# Patient Record
Sex: Female | Born: 1935 | Race: White | Hispanic: No | Smoking: Current every day smoker
Health system: Southern US, Community
[De-identification: ages and names within clinical notes are randomized; demographics above are authoritative.]

## PROBLEM LIST (undated history)

## (undated) DIAGNOSIS — I350 Nonrheumatic aortic (valve) stenosis: Secondary | ICD-10-CM

## (undated) DIAGNOSIS — C3490 Malignant neoplasm of unspecified part of unspecified bronchus or lung: Secondary | ICD-10-CM

## (undated) DIAGNOSIS — M1990 Unspecified osteoarthritis, unspecified site: Secondary | ICD-10-CM

## (undated) DIAGNOSIS — I2510 Atherosclerotic heart disease of native coronary artery without angina pectoris: Secondary | ICD-10-CM

## (undated) DIAGNOSIS — C649 Malignant neoplasm of unspecified kidney, except renal pelvis: Secondary | ICD-10-CM

## (undated) DIAGNOSIS — J449 Chronic obstructive pulmonary disease, unspecified: Secondary | ICD-10-CM

## (undated) DIAGNOSIS — J302 Other seasonal allergic rhinitis: Secondary | ICD-10-CM

## (undated) DIAGNOSIS — K219 Gastro-esophageal reflux disease without esophagitis: Secondary | ICD-10-CM

## (undated) DIAGNOSIS — E1140 Type 2 diabetes mellitus with diabetic neuropathy, unspecified: Secondary | ICD-10-CM

## (undated) DIAGNOSIS — G5600 Carpal tunnel syndrome, unspecified upper limb: Secondary | ICD-10-CM

## (undated) DIAGNOSIS — F329 Major depressive disorder, single episode, unspecified: Secondary | ICD-10-CM

## (undated) DIAGNOSIS — M542 Cervicalgia: Secondary | ICD-10-CM

## (undated) DIAGNOSIS — I10 Essential (primary) hypertension: Secondary | ICD-10-CM

## (undated) DIAGNOSIS — R918 Other nonspecific abnormal finding of lung field: Secondary | ICD-10-CM

## (undated) DIAGNOSIS — K760 Fatty (change of) liver, not elsewhere classified: Secondary | ICD-10-CM

## (undated) DIAGNOSIS — M5030 Other cervical disc degeneration, unspecified cervical region: Secondary | ICD-10-CM

## (undated) DIAGNOSIS — F32A Depression, unspecified: Secondary | ICD-10-CM

## (undated) DIAGNOSIS — E785 Hyperlipidemia, unspecified: Secondary | ICD-10-CM

## (undated) HISTORY — DX: Nonrheumatic aortic (valve) stenosis: I35.0

## (undated) HISTORY — DX: Essential (primary) hypertension: I10

## (undated) HISTORY — DX: Chronic obstructive pulmonary disease, unspecified: J44.9

## (undated) HISTORY — DX: Fatty (change of) liver, not elsewhere classified: K76.0

## (undated) HISTORY — DX: Other seasonal allergic rhinitis: J30.2

## (undated) HISTORY — DX: Atherosclerotic heart disease of native coronary artery without angina pectoris: I25.10

## (undated) HISTORY — DX: Cervicalgia: M54.2

## (undated) HISTORY — DX: Other nonspecific abnormal finding of lung field: R91.8

## (undated) HISTORY — DX: Malignant neoplasm of unspecified part of unspecified bronchus or lung: C34.90

## (undated) HISTORY — DX: Hyperlipidemia, unspecified: E78.5

## (undated) HISTORY — PX: LUMBAR DISC SURGERY: SHX700

## (undated) HISTORY — DX: Type 2 diabetes mellitus with diabetic neuropathy, unspecified: E11.40

## (undated) HISTORY — DX: Gastro-esophageal reflux disease without esophagitis: K21.9

## (undated) HISTORY — DX: Other cervical disc degeneration, unspecified cervical region: M50.30

## (undated) HISTORY — DX: Depression, unspecified: F32.A

## (undated) HISTORY — DX: Unspecified osteoarthritis, unspecified site: M19.90

## (undated) HISTORY — DX: Malignant neoplasm of unspecified kidney, except renal pelvis: C64.9

## (undated) HISTORY — DX: Carpal tunnel syndrome, unspecified upper limb: G56.00

## (undated) HISTORY — DX: Major depressive disorder, single episode, unspecified: F32.9

---

## 1968-07-16 HISTORY — PX: BREAST EXCISIONAL BIOPSY: SUR124

## 1975-07-17 HISTORY — PX: ABDOMINAL HYSTERECTOMY: SHX81

## 1998-10-14 ENCOUNTER — Other Ambulatory Visit: Admission: RE | Admit: 1998-10-14 | Discharge: 1998-10-14 | Payer: Self-pay | Admitting: Physician Assistant

## 1998-10-28 ENCOUNTER — Encounter: Payer: Self-pay | Admitting: Family Medicine

## 1998-10-28 ENCOUNTER — Ambulatory Visit (HOSPITAL_COMMUNITY): Admission: RE | Admit: 1998-10-28 | Discharge: 1998-10-28 | Payer: Self-pay

## 1999-11-03 ENCOUNTER — Encounter: Payer: Self-pay | Admitting: Family Medicine

## 1999-11-03 ENCOUNTER — Ambulatory Visit (HOSPITAL_COMMUNITY): Admission: RE | Admit: 1999-11-03 | Discharge: 1999-11-03 | Payer: Self-pay | Admitting: Family Medicine

## 2000-09-06 ENCOUNTER — Encounter: Payer: Self-pay | Admitting: Internal Medicine

## 2000-09-06 ENCOUNTER — Encounter: Admission: RE | Admit: 2000-09-06 | Discharge: 2000-09-06 | Payer: Self-pay | Admitting: Internal Medicine

## 2000-11-07 ENCOUNTER — Encounter: Payer: Self-pay | Admitting: Internal Medicine

## 2000-11-07 ENCOUNTER — Inpatient Hospital Stay (HOSPITAL_COMMUNITY): Admission: EM | Admit: 2000-11-07 | Discharge: 2000-11-08 | Payer: Self-pay | Admitting: Emergency Medicine

## 2001-10-12 ENCOUNTER — Emergency Department (HOSPITAL_COMMUNITY): Admission: EM | Admit: 2001-10-12 | Discharge: 2001-10-12 | Payer: Self-pay

## 2001-10-20 ENCOUNTER — Encounter: Admission: RE | Admit: 2001-10-20 | Discharge: 2001-10-20 | Payer: Self-pay | Admitting: Urology

## 2001-10-20 ENCOUNTER — Encounter: Payer: Self-pay | Admitting: Urology

## 2001-10-22 ENCOUNTER — Ambulatory Visit (HOSPITAL_BASED_OUTPATIENT_CLINIC_OR_DEPARTMENT_OTHER): Admission: RE | Admit: 2001-10-22 | Discharge: 2001-10-22 | Payer: Self-pay | Admitting: Urology

## 2001-10-22 ENCOUNTER — Encounter (INDEPENDENT_AMBULATORY_CARE_PROVIDER_SITE_OTHER): Payer: Self-pay | Admitting: Specialist

## 2001-11-07 ENCOUNTER — Encounter: Payer: Self-pay | Admitting: Urology

## 2001-11-14 ENCOUNTER — Inpatient Hospital Stay (HOSPITAL_COMMUNITY): Admission: RE | Admit: 2001-11-14 | Discharge: 2001-11-19 | Payer: Self-pay | Admitting: Urology

## 2001-11-14 ENCOUNTER — Encounter (INDEPENDENT_AMBULATORY_CARE_PROVIDER_SITE_OTHER): Payer: Self-pay | Admitting: Specialist

## 2002-01-19 ENCOUNTER — Encounter: Admission: RE | Admit: 2002-01-19 | Discharge: 2002-04-19 | Payer: Self-pay | Admitting: Internal Medicine

## 2002-04-20 ENCOUNTER — Emergency Department (HOSPITAL_COMMUNITY): Admission: EM | Admit: 2002-04-20 | Discharge: 2002-04-20 | Payer: Self-pay | Admitting: Emergency Medicine

## 2002-04-28 ENCOUNTER — Encounter: Payer: Self-pay | Admitting: Emergency Medicine

## 2002-04-28 ENCOUNTER — Inpatient Hospital Stay (HOSPITAL_COMMUNITY): Admission: EM | Admit: 2002-04-28 | Discharge: 2002-05-03 | Payer: Self-pay | Admitting: Emergency Medicine

## 2002-07-16 HISTORY — PX: NEPHRECTOMY: SHX65

## 2002-07-27 ENCOUNTER — Encounter: Payer: Self-pay | Admitting: Internal Medicine

## 2002-07-27 ENCOUNTER — Encounter: Admission: RE | Admit: 2002-07-27 | Discharge: 2002-07-27 | Payer: Self-pay | Admitting: Internal Medicine

## 2002-08-03 ENCOUNTER — Encounter: Admission: RE | Admit: 2002-08-03 | Discharge: 2002-08-03 | Payer: Self-pay | Admitting: Urology

## 2002-08-03 ENCOUNTER — Encounter: Payer: Self-pay | Admitting: Urology

## 2003-01-26 ENCOUNTER — Encounter: Payer: Self-pay | Admitting: Urology

## 2003-01-26 ENCOUNTER — Encounter: Admission: RE | Admit: 2003-01-26 | Discharge: 2003-01-26 | Payer: Self-pay | Admitting: Urology

## 2003-07-17 DIAGNOSIS — C649 Malignant neoplasm of unspecified kidney, except renal pelvis: Secondary | ICD-10-CM

## 2003-07-17 HISTORY — DX: Malignant neoplasm of unspecified kidney, except renal pelvis: C64.9

## 2004-01-28 LAB — HM DEXA SCAN

## 2004-03-02 ENCOUNTER — Emergency Department (HOSPITAL_COMMUNITY): Admission: EM | Admit: 2004-03-02 | Discharge: 2004-03-02 | Payer: Self-pay | Admitting: Emergency Medicine

## 2004-07-16 DIAGNOSIS — I2510 Atherosclerotic heart disease of native coronary artery without angina pectoris: Secondary | ICD-10-CM

## 2004-07-16 DIAGNOSIS — C3490 Malignant neoplasm of unspecified part of unspecified bronchus or lung: Secondary | ICD-10-CM

## 2004-07-16 HISTORY — DX: Malignant neoplasm of unspecified part of unspecified bronchus or lung: C34.90

## 2004-07-16 HISTORY — DX: Atherosclerotic heart disease of native coronary artery without angina pectoris: I25.10

## 2004-07-19 ENCOUNTER — Ambulatory Visit (HOSPITAL_COMMUNITY): Admission: RE | Admit: 2004-07-19 | Discharge: 2004-07-19 | Payer: Self-pay | Admitting: Urology

## 2004-07-20 ENCOUNTER — Encounter: Admission: RE | Admit: 2004-07-20 | Discharge: 2004-07-20 | Payer: Self-pay | Admitting: Internal Medicine

## 2004-10-14 HISTORY — PX: CARDIAC CATHETERIZATION: SHX172

## 2005-07-16 HISTORY — PX: SPIROMETRY: SHX456

## 2005-07-16 HISTORY — PX: LUNG CANCER SURGERY: SHX702

## 2005-07-23 ENCOUNTER — Ambulatory Visit: Admission: RE | Admit: 2005-07-23 | Discharge: 2005-07-23 | Payer: Self-pay | Admitting: Thoracic Surgery

## 2005-07-24 ENCOUNTER — Ambulatory Visit (HOSPITAL_COMMUNITY): Admission: RE | Admit: 2005-07-24 | Discharge: 2005-07-24 | Payer: Self-pay | Admitting: Thoracic Surgery

## 2005-08-03 ENCOUNTER — Encounter (INDEPENDENT_AMBULATORY_CARE_PROVIDER_SITE_OTHER): Payer: Self-pay | Admitting: Specialist

## 2005-08-03 ENCOUNTER — Inpatient Hospital Stay (HOSPITAL_COMMUNITY): Admission: RE | Admit: 2005-08-03 | Discharge: 2005-08-10 | Payer: Self-pay | Admitting: Thoracic Surgery

## 2005-08-03 ENCOUNTER — Ambulatory Visit: Payer: Self-pay | Admitting: Critical Care Medicine

## 2005-08-10 ENCOUNTER — Ambulatory Visit: Payer: Self-pay | Admitting: Internal Medicine

## 2005-08-15 ENCOUNTER — Encounter: Admission: RE | Admit: 2005-08-15 | Discharge: 2005-08-15 | Payer: Self-pay | Admitting: Thoracic Surgery

## 2005-09-05 ENCOUNTER — Encounter: Admission: RE | Admit: 2005-09-05 | Discharge: 2005-09-05 | Payer: Self-pay | Admitting: Thoracic Surgery

## 2005-09-11 ENCOUNTER — Encounter: Admission: RE | Admit: 2005-09-11 | Discharge: 2005-09-11 | Payer: Self-pay | Admitting: Internal Medicine

## 2005-10-03 ENCOUNTER — Encounter: Admission: RE | Admit: 2005-10-03 | Discharge: 2005-10-03 | Payer: Self-pay | Admitting: Thoracic Surgery

## 2005-10-08 ENCOUNTER — Emergency Department (HOSPITAL_COMMUNITY): Admission: EM | Admit: 2005-10-08 | Discharge: 2005-10-08 | Payer: Self-pay | Admitting: Emergency Medicine

## 2005-11-14 ENCOUNTER — Encounter: Admission: RE | Admit: 2005-11-14 | Discharge: 2005-11-14 | Payer: Self-pay | Admitting: Thoracic Surgery

## 2006-02-14 ENCOUNTER — Ambulatory Visit: Payer: Self-pay | Admitting: Internal Medicine

## 2006-06-01 ENCOUNTER — Emergency Department (HOSPITAL_COMMUNITY): Admission: EM | Admit: 2006-06-01 | Discharge: 2006-06-01 | Payer: Self-pay | Admitting: Emergency Medicine

## 2006-06-12 ENCOUNTER — Encounter: Admission: RE | Admit: 2006-06-12 | Discharge: 2006-06-12 | Payer: Self-pay | Admitting: Internal Medicine

## 2006-06-19 ENCOUNTER — Encounter: Admission: RE | Admit: 2006-06-19 | Discharge: 2006-06-19 | Payer: Self-pay | Admitting: Thoracic Surgery

## 2006-07-07 IMAGING — CR DG CHEST 1V PORT
1 series · 1 of 1 positions shown · non-contrast
Comparison: none

CLINICAL DATA: Status post right thoracotomy chest tube.
 PORTABLE CHEST ? 1 VIEW:

[view not recorded]
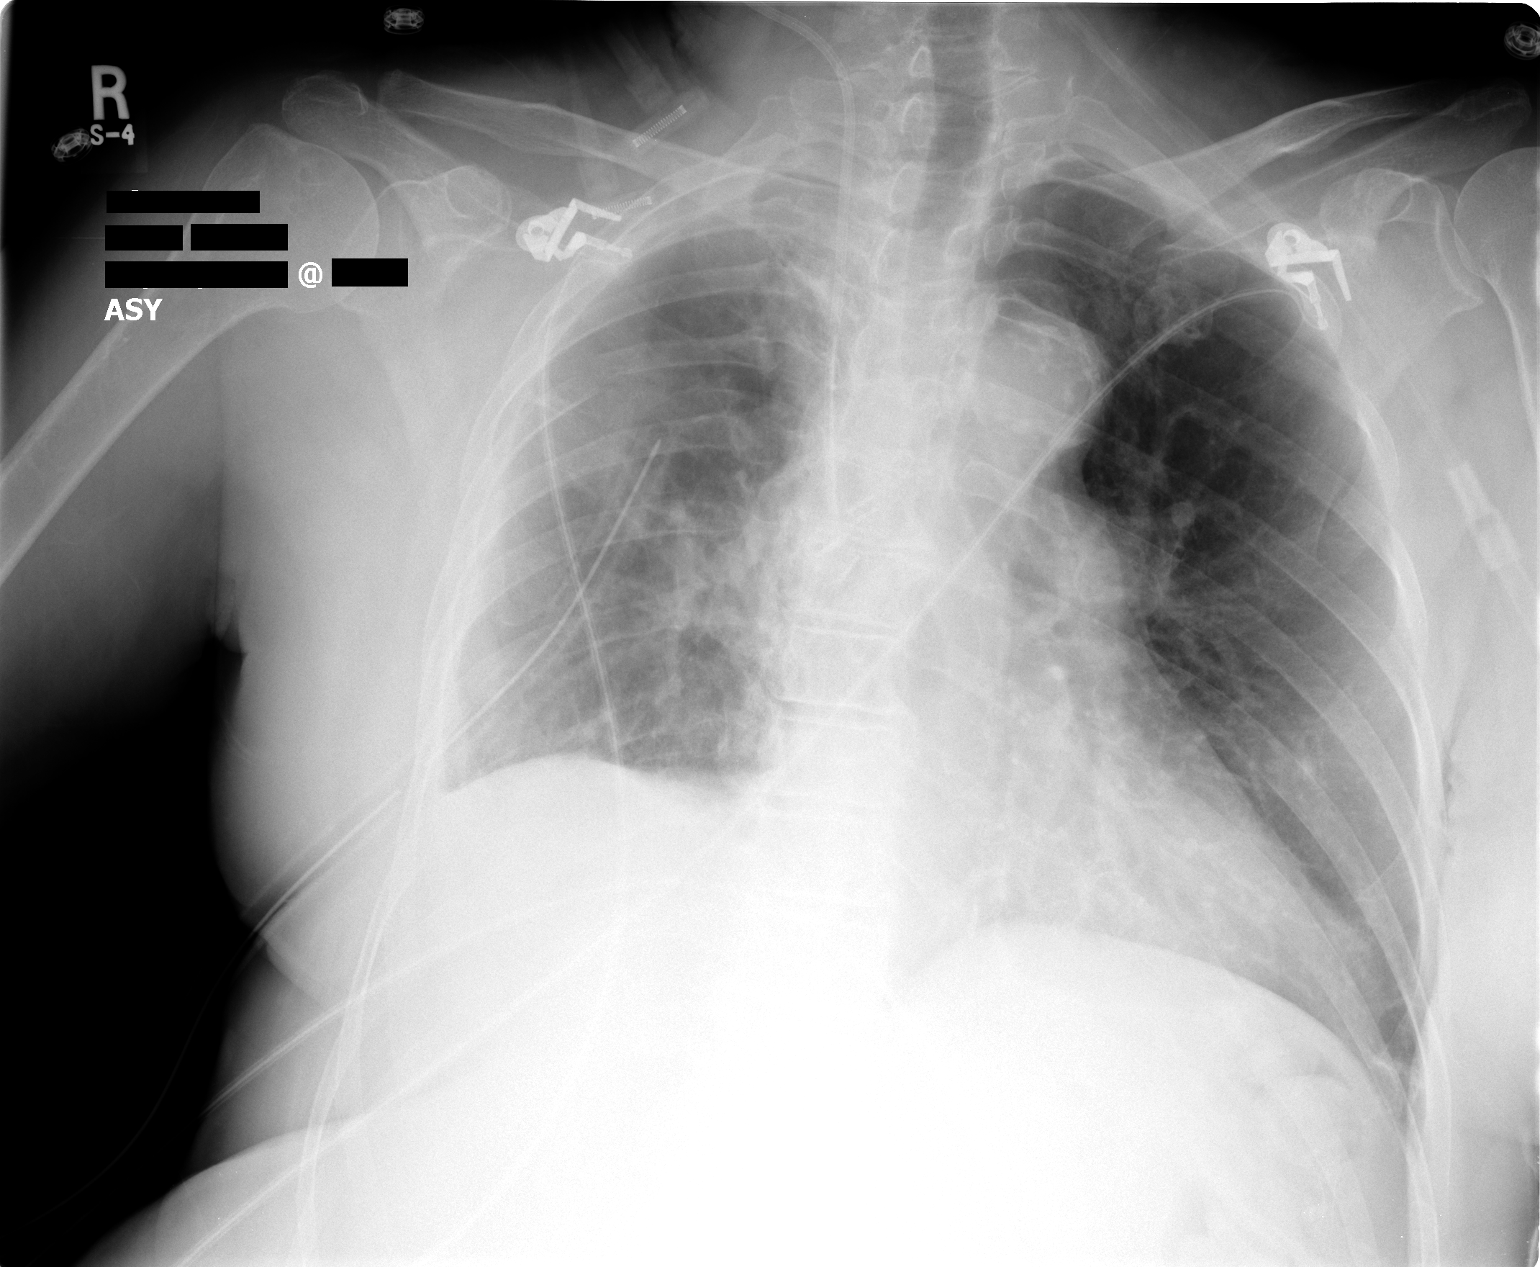

[1 of 1 positions shown; findings below may reference images not displayed]

FINDINGS: An AP semierect portable film of the chest made 08/09/05 at 9619 hours is compared to previous study of 08/08/05 at 4692 hours and shows significant improvement in right lung aeration.  There remains a single right chest tube, site hole of which is just at the level of the rib cage.  The right jugular central venous catheter remains in place.  There is again noted cardiomegaly.  No significant pneumothorax is seen.
IMPRESSION: Significant interval improvement in right lung aeration.  Single right chest tube remains in place.  Stable right jugular catheter.

## 2006-08-03 IMAGING — CR DG CHEST 2V
2 series · 2 of 2 positions shown · non-contrast
Comparison: 08/15/05.

CLINICAL DATA: Lung surgery July 2005.  Shortness of breath.  Followup.
 CHEST - 2 VIEW:

[w chest pa]
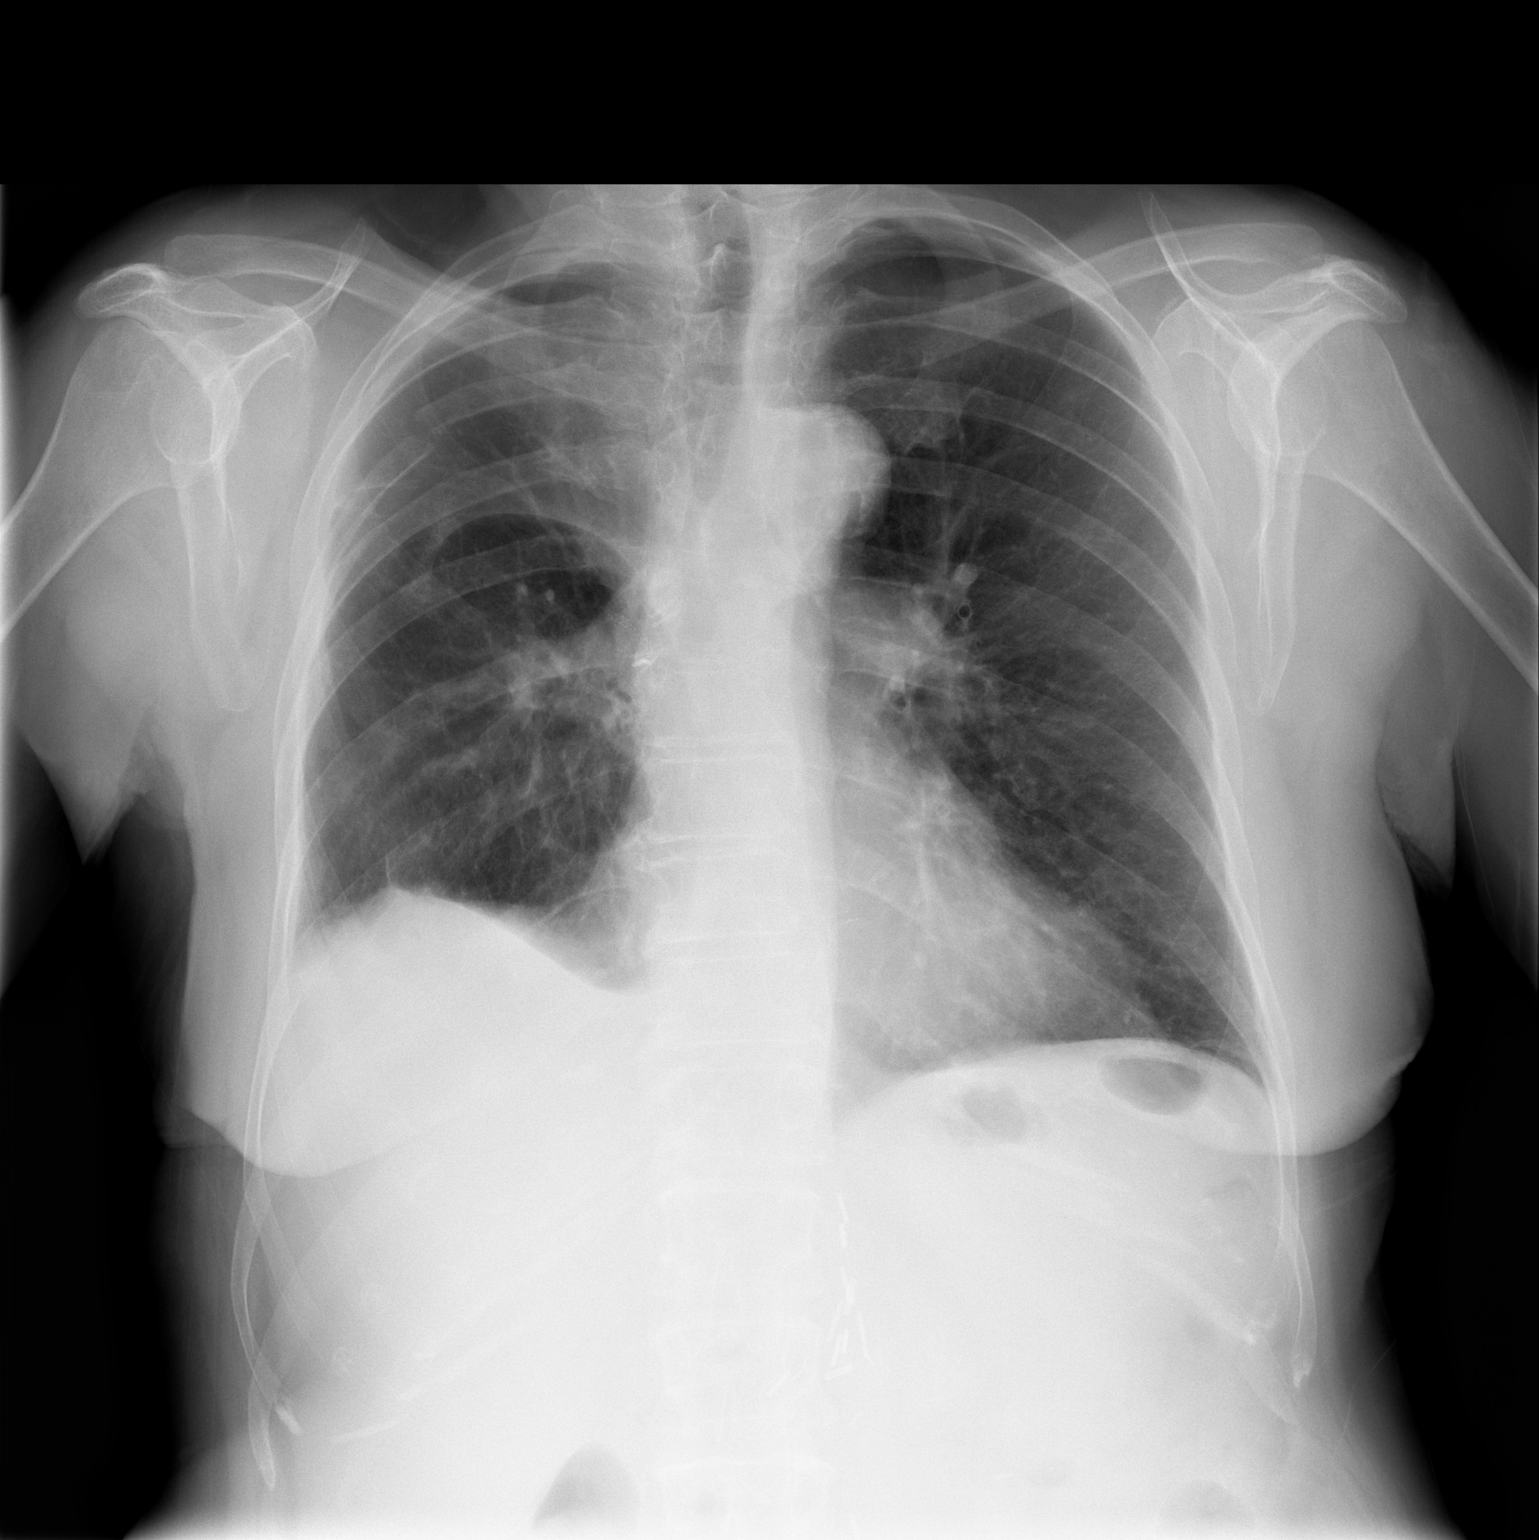

[w chest lat]
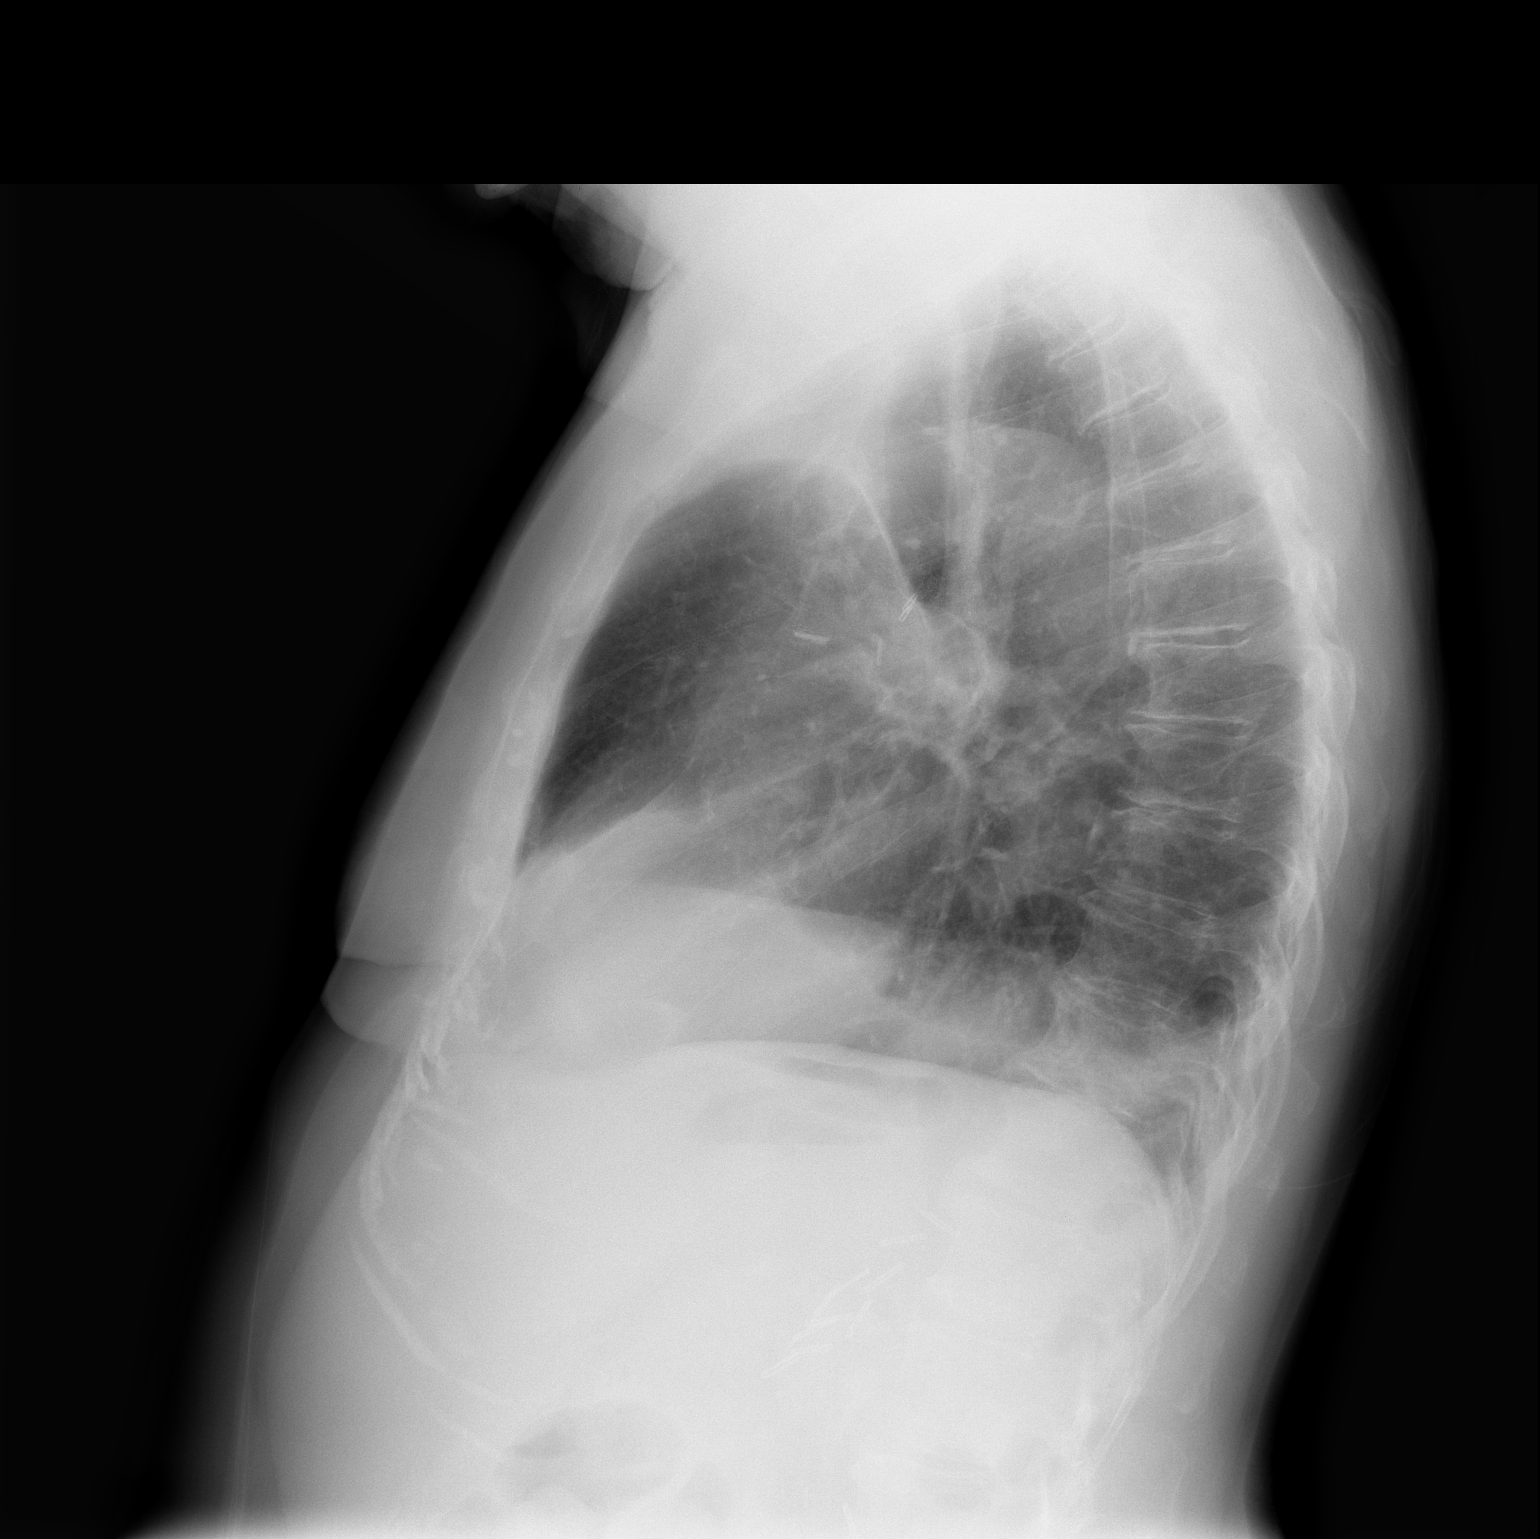

[2 of 2 positions shown; findings below may reference images not displayed]

FINDINGS: Two views of the chest show stable postop changes on the right.  The left lung is clear.  Heart size is stable.
IMPRESSION: Stable postop changes on the right.

## 2006-08-31 IMAGING — CR DG CHEST 2V
2 series · 2 of 2 positions shown · non-contrast
Comparison: 09/05/05.

CLINICAL DATA: Right lung surgery, lung lesion. 
 CHEST, TWO VIEWS:

[w chest pa]
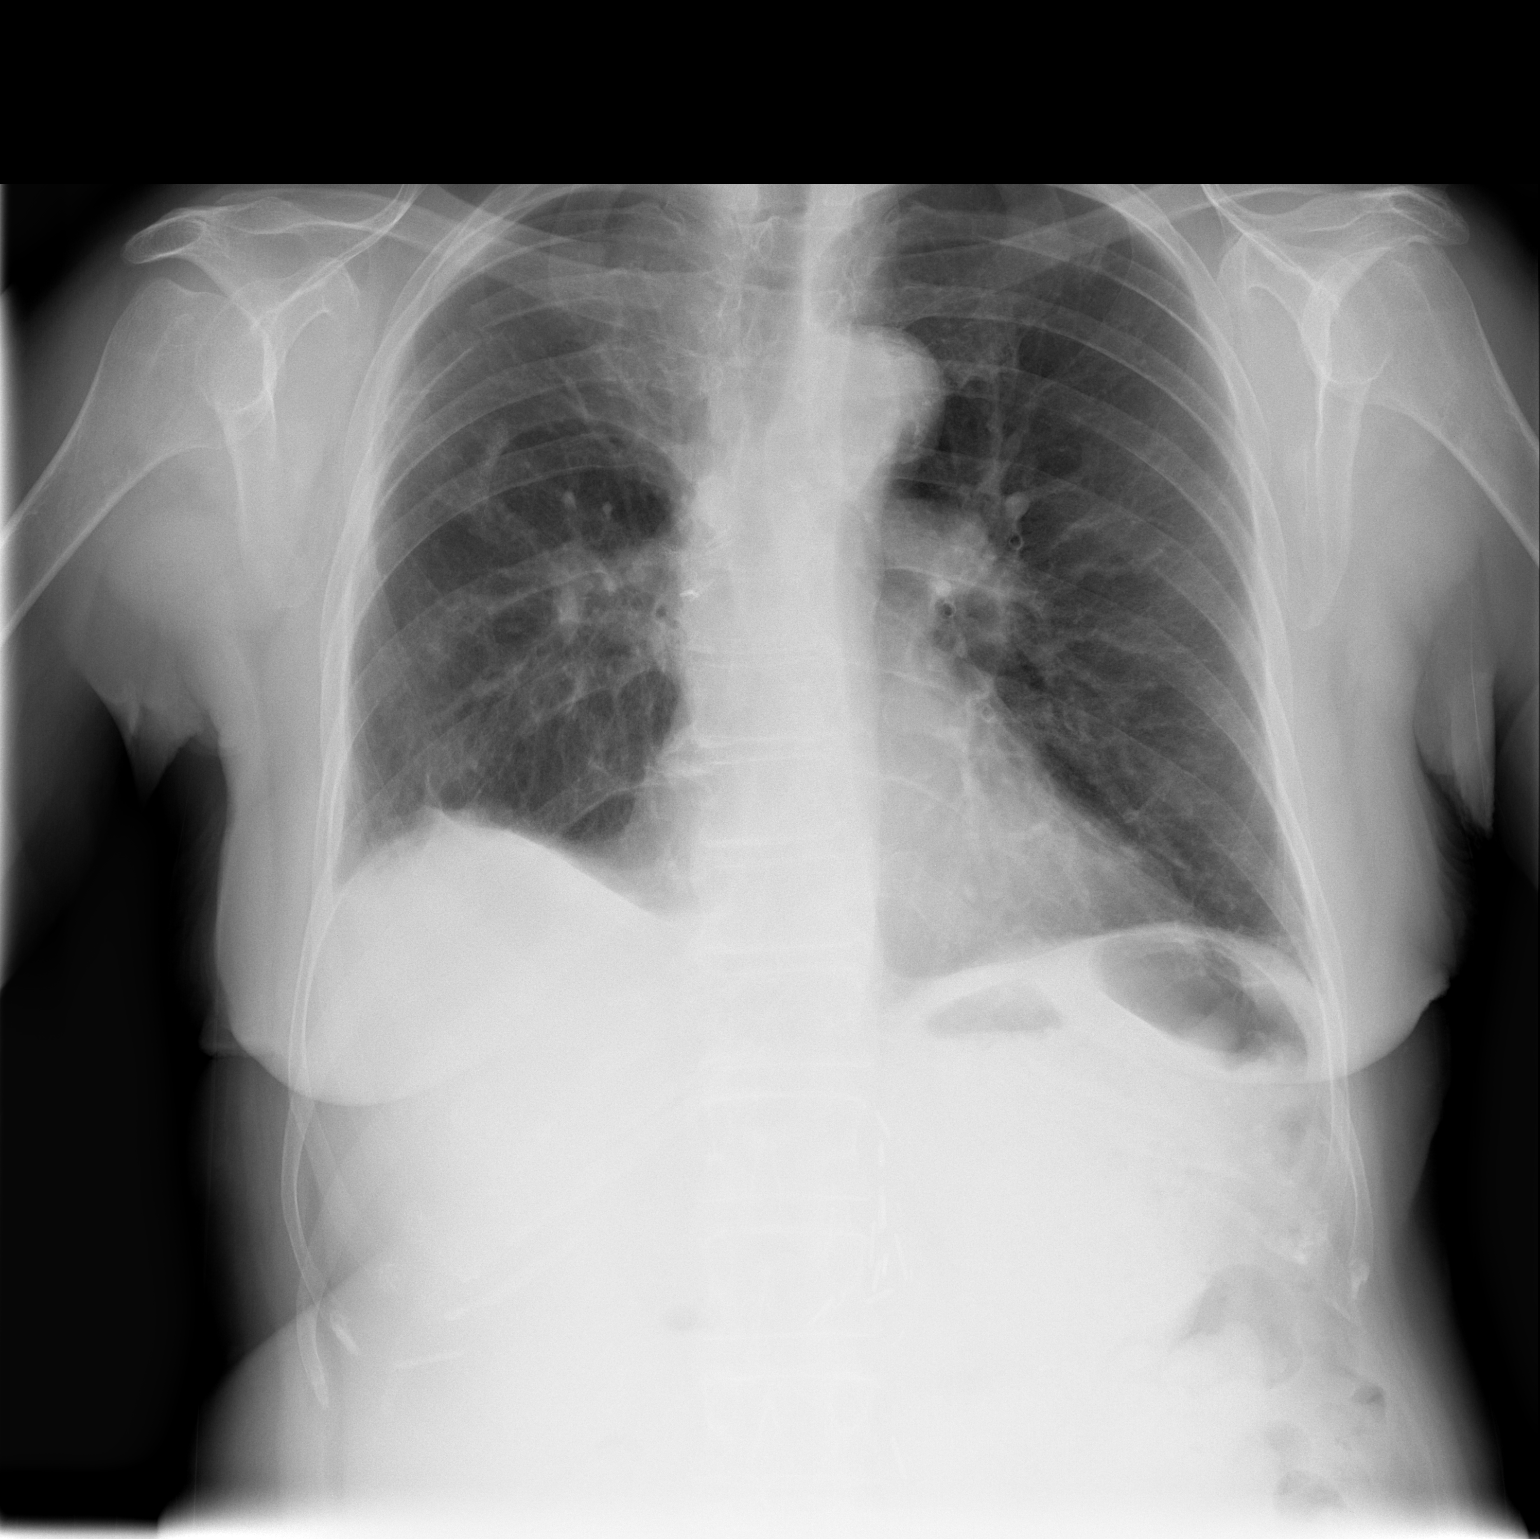

[w chest lat]
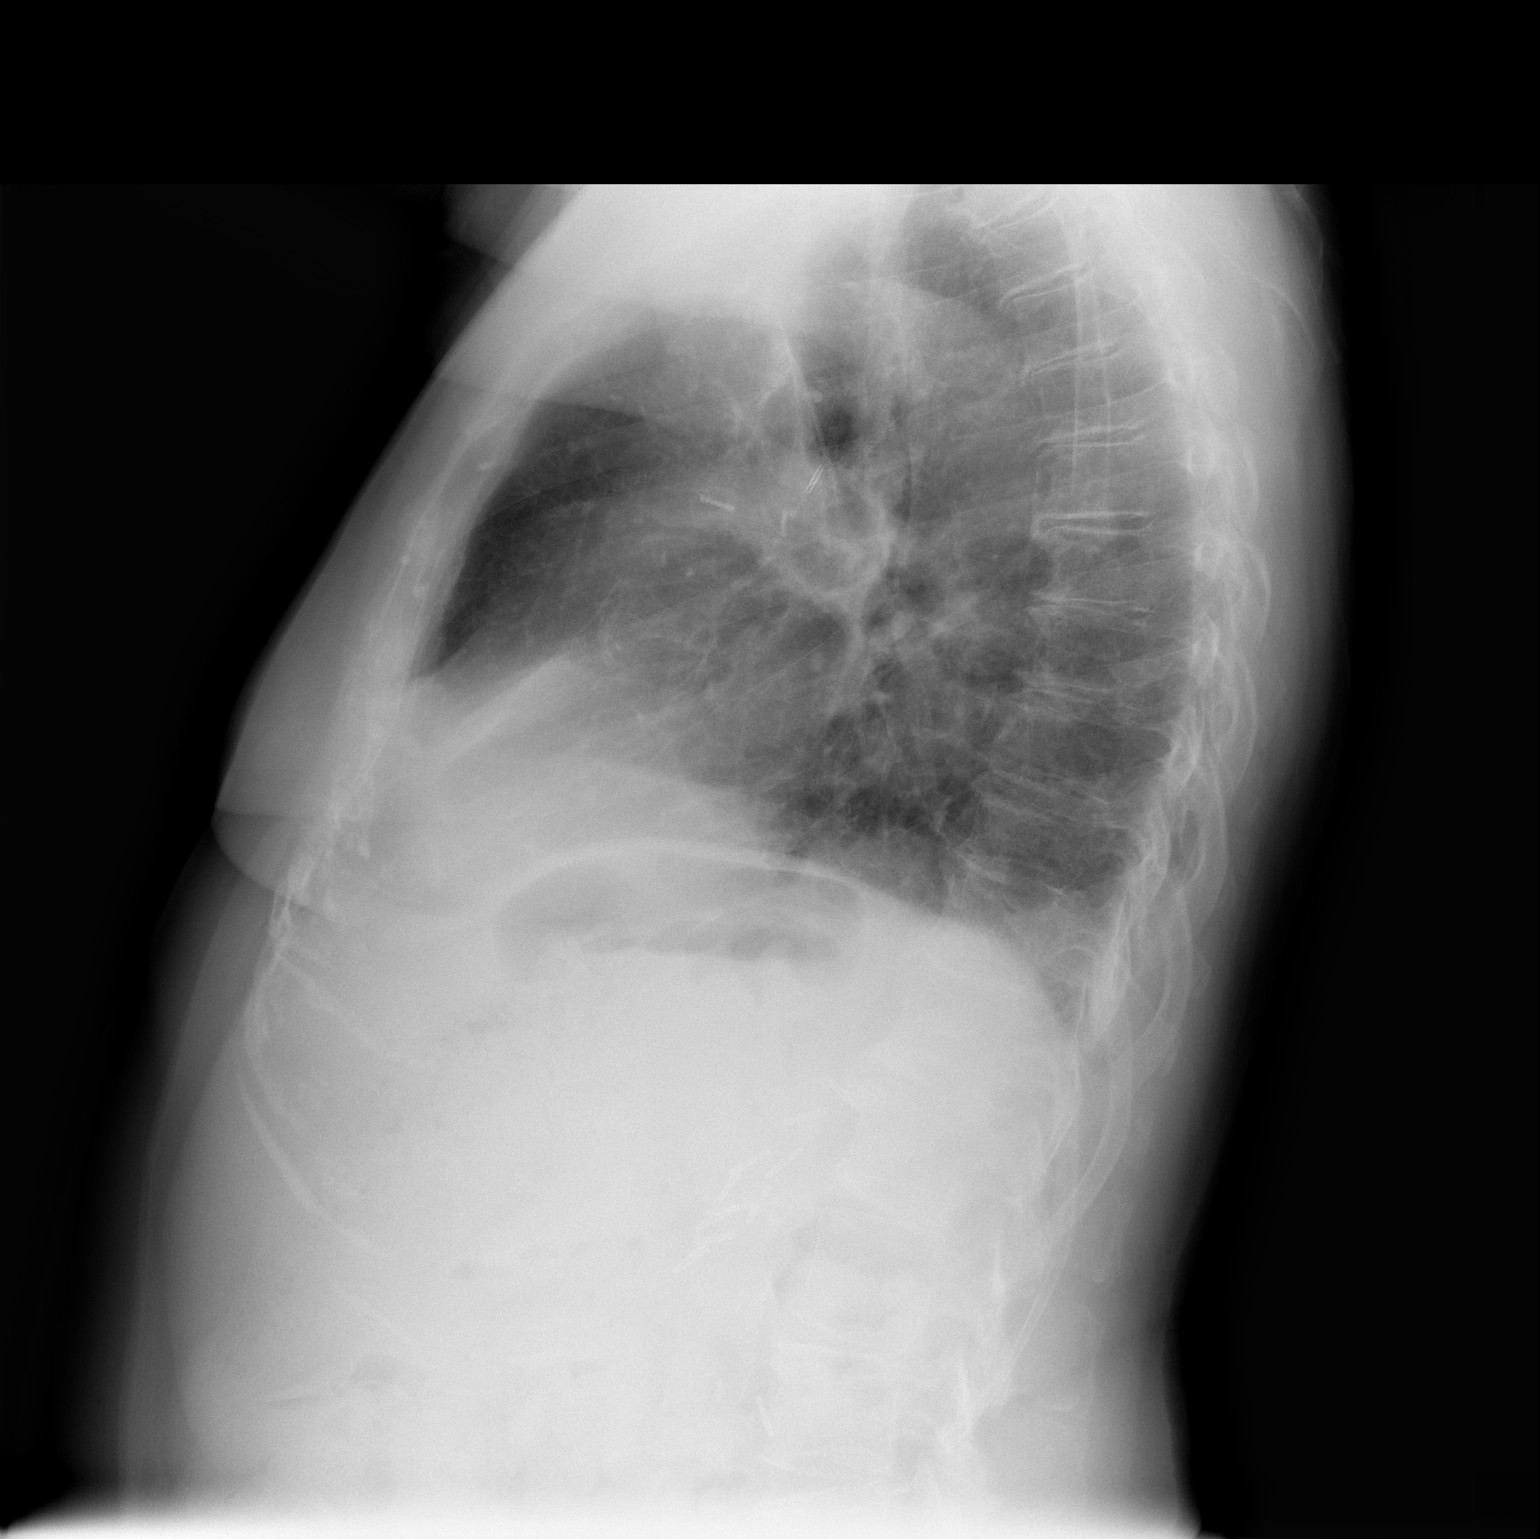

[2 of 2 positions shown; findings below may reference images not displayed]

Post-op clip on the right with some increased density in the right mid lung similar to the prior study.  There is some elevation of the right hemidiaphragm and right pleural scarring which is unchanged.  The left lung is clear and there is no pneumonia.
IMPRESSION: Post-op changes on the right are stable from the prior study.

## 2006-11-20 ENCOUNTER — Ambulatory Visit: Payer: Self-pay | Admitting: Thoracic Surgery

## 2007-05-07 ENCOUNTER — Encounter: Admission: RE | Admit: 2007-05-07 | Discharge: 2007-05-07 | Payer: Self-pay | Admitting: Internal Medicine

## 2007-05-07 LAB — HM MAMMOGRAPHY

## 2007-05-13 ENCOUNTER — Encounter: Admission: RE | Admit: 2007-05-13 | Discharge: 2007-05-13 | Payer: Self-pay | Admitting: Thoracic Surgery

## 2007-05-13 ENCOUNTER — Ambulatory Visit: Payer: Self-pay | Admitting: Thoracic Surgery

## 2007-11-06 ENCOUNTER — Ambulatory Visit: Payer: Self-pay | Admitting: Thoracic Surgery

## 2008-05-05 ENCOUNTER — Encounter: Admission: RE | Admit: 2008-05-05 | Discharge: 2008-05-05 | Payer: Self-pay | Admitting: Thoracic Surgery

## 2008-05-05 ENCOUNTER — Ambulatory Visit: Payer: Self-pay | Admitting: Thoracic Surgery

## 2008-05-07 ENCOUNTER — Encounter: Admission: RE | Admit: 2008-05-07 | Discharge: 2008-05-07 | Payer: Self-pay | Admitting: Internal Medicine

## 2008-05-07 LAB — HM MAMMOGRAPHY

## 2008-08-04 ENCOUNTER — Encounter: Admission: RE | Admit: 2008-08-04 | Discharge: 2008-08-04 | Payer: Self-pay | Admitting: Thoracic Surgery

## 2008-08-04 ENCOUNTER — Ambulatory Visit: Payer: Self-pay | Admitting: Thoracic Surgery

## 2009-02-02 ENCOUNTER — Ambulatory Visit: Payer: Self-pay | Admitting: Thoracic Surgery

## 2009-02-02 ENCOUNTER — Encounter: Admission: RE | Admit: 2009-02-02 | Discharge: 2009-02-02 | Payer: Self-pay | Admitting: Thoracic Surgery

## 2009-05-27 ENCOUNTER — Encounter: Admission: RE | Admit: 2009-05-27 | Discharge: 2009-05-27 | Payer: Self-pay | Admitting: Internal Medicine

## 2009-06-06 ENCOUNTER — Encounter: Admission: RE | Admit: 2009-06-06 | Discharge: 2009-06-06 | Payer: Self-pay | Admitting: Internal Medicine

## 2009-07-19 ENCOUNTER — Encounter: Admission: RE | Admit: 2009-07-19 | Discharge: 2009-07-19 | Payer: Self-pay | Admitting: Thoracic Surgery

## 2009-07-19 ENCOUNTER — Ambulatory Visit: Payer: Self-pay | Admitting: Thoracic Surgery

## 2009-08-29 LAB — HM DEXA SCAN

## 2010-02-22 ENCOUNTER — Ambulatory Visit: Payer: Self-pay | Admitting: Thoracic Surgery

## 2010-02-22 ENCOUNTER — Encounter: Admission: RE | Admit: 2010-02-22 | Discharge: 2010-02-22 | Payer: Self-pay | Admitting: Thoracic Surgery

## 2010-04-19 LAB — MICROALBUMIN / CREATININE URINE RATIO
Creatinine, Ur: 28 mg/dL
Microalb Creat Ratio: 27.86
Microalb, Ur: 0.8

## 2010-04-19 LAB — VITAMIN D 25 HYDROXY (VIT D DEFICIENCY, FRACTURES): Vit D, 25-Hydroxy: 35.4

## 2010-04-19 LAB — COMPLETE METABOLIC PANEL WITH GFR
Alkaline Phosphatase: 71 U/L
BUN: 13 mg/dL (ref 4–21)
Calcium, serum, ionized: 9.8
Total Protein ELP: 7.3
Total bilirubin, fluid: 0.5

## 2010-04-19 LAB — LIPID PANEL: Triglycerides: 285

## 2010-07-06 ENCOUNTER — Encounter
Admission: RE | Admit: 2010-07-06 | Discharge: 2010-07-06 | Payer: Self-pay | Source: Home / Self Care | Attending: Internal Medicine | Admitting: Internal Medicine

## 2010-07-06 LAB — HM MAMMOGRAPHY

## 2010-08-05 ENCOUNTER — Other Ambulatory Visit: Payer: Self-pay | Admitting: Thoracic Surgery

## 2010-08-05 DIAGNOSIS — R911 Solitary pulmonary nodule: Secondary | ICD-10-CM

## 2010-08-06 ENCOUNTER — Encounter: Payer: Self-pay | Admitting: Thoracic Surgery

## 2010-08-23 ENCOUNTER — Ambulatory Visit (INDEPENDENT_AMBULATORY_CARE_PROVIDER_SITE_OTHER): Payer: Medicare Other | Admitting: Thoracic Surgery

## 2010-08-23 ENCOUNTER — Ambulatory Visit
Admission: RE | Admit: 2010-08-23 | Discharge: 2010-08-23 | Disposition: A | Discharge status: Home / Self Care | Payer: Medicare Other | Source: Ambulatory Visit | Attending: Thoracic Surgery | Admitting: Thoracic Surgery

## 2010-08-23 DIAGNOSIS — R911 Solitary pulmonary nodule: Secondary | ICD-10-CM

## 2010-08-23 DIAGNOSIS — C3410 Malignant neoplasm of upper lobe, unspecified bronchus or lung: Secondary | ICD-10-CM

## 2010-08-25 NOTE — Assessment & Plan Note (Signed)
OFFICE VISIT  DIANTHA, PAXSON DOB:  1931/10/22                                        August 23, 2010 CHART #:  [id number]  The patient returned today.  Her blood pressure is 128/74, pulse 86, respirations 16, saturations are 97%.  She had a recent fall and sustained some injury to her neck and has some numbness in the ulnar distribution of her left hand.  She is being followed for this by Dr. Priscille Kluver.  Her CT scan of the chest showed no evidence of recurrence of her cancer although we do not have the final report.  Since it has been 5 years, I will let her medical doctor follow her.  Ines Bloomer, M.D. Electronically Signed  DPB/MEDQ  D:  08/23/2010  T:  08/24/2010  Job:  [id number]

## 2010-09-26 ENCOUNTER — Encounter: Payer: Self-pay | Admitting: Family Medicine

## 2010-09-26 ENCOUNTER — Ambulatory Visit: Payer: Medicare Other | Admitting: Family Medicine

## 2010-09-26 DIAGNOSIS — J441 Chronic obstructive pulmonary disease with (acute) exacerbation: Secondary | ICD-10-CM

## 2010-09-26 DIAGNOSIS — Z87891 Personal history of nicotine dependence: Secondary | ICD-10-CM

## 2010-09-26 DIAGNOSIS — F17200 Nicotine dependence, unspecified, uncomplicated: Secondary | ICD-10-CM

## 2010-09-26 DIAGNOSIS — I10 Essential (primary) hypertension: Secondary | ICD-10-CM

## 2010-09-26 DIAGNOSIS — J449 Chronic obstructive pulmonary disease, unspecified: Secondary | ICD-10-CM

## 2010-09-26 DIAGNOSIS — J438 Other emphysema: Secondary | ICD-10-CM

## 2010-09-26 DIAGNOSIS — Z720 Tobacco use: Secondary | ICD-10-CM | POA: Insufficient documentation

## 2010-09-26 DIAGNOSIS — E1149 Type 2 diabetes mellitus with other diabetic neurological complication: Secondary | ICD-10-CM | POA: Insufficient documentation

## 2010-09-26 HISTORY — DX: Chronic obstructive pulmonary disease, unspecified: J44.9

## 2010-10-03 ENCOUNTER — Ambulatory Visit (INDEPENDENT_AMBULATORY_CARE_PROVIDER_SITE_OTHER): Payer: Medicare Other | Admitting: Family Medicine

## 2010-10-03 ENCOUNTER — Encounter: Payer: Self-pay | Admitting: Family Medicine

## 2010-10-03 VITALS — BP 130/68 | HR 96 | Temp 98.1°F | Ht 66.0 in | Wt 147.0 lb

## 2010-10-03 DIAGNOSIS — K219 Gastro-esophageal reflux disease without esophagitis: Secondary | ICD-10-CM

## 2010-10-03 DIAGNOSIS — F17200 Nicotine dependence, unspecified, uncomplicated: Secondary | ICD-10-CM

## 2010-10-03 DIAGNOSIS — E1149 Type 2 diabetes mellitus with other diabetic neurological complication: Secondary | ICD-10-CM

## 2010-10-03 DIAGNOSIS — M1990 Unspecified osteoarthritis, unspecified site: Secondary | ICD-10-CM

## 2010-10-03 DIAGNOSIS — I10 Essential (primary) hypertension: Secondary | ICD-10-CM

## 2010-10-03 DIAGNOSIS — C649 Malignant neoplasm of unspecified kidney, except renal pelvis: Secondary | ICD-10-CM

## 2010-10-03 DIAGNOSIS — C3490 Malignant neoplasm of unspecified part of unspecified bronchus or lung: Secondary | ICD-10-CM

## 2010-10-03 DIAGNOSIS — J302 Other seasonal allergic rhinitis: Secondary | ICD-10-CM

## 2010-10-03 DIAGNOSIS — Z720 Tobacco use: Secondary | ICD-10-CM

## 2010-10-03 DIAGNOSIS — F32A Depression, unspecified: Secondary | ICD-10-CM

## 2010-10-03 DIAGNOSIS — M79673 Pain in unspecified foot: Secondary | ICD-10-CM | POA: Insufficient documentation

## 2010-10-03 DIAGNOSIS — F329 Major depressive disorder, single episode, unspecified: Secondary | ICD-10-CM

## 2010-10-03 DIAGNOSIS — G629 Polyneuropathy, unspecified: Secondary | ICD-10-CM

## 2010-10-03 DIAGNOSIS — G589 Mononeuropathy, unspecified: Secondary | ICD-10-CM

## 2010-10-03 DIAGNOSIS — J309 Allergic rhinitis, unspecified: Secondary | ICD-10-CM

## 2010-10-03 DIAGNOSIS — E785 Hyperlipidemia, unspecified: Secondary | ICD-10-CM

## 2010-10-03 MED ORDER — CHOLECALCIFEROL 25 MCG (1000 UT) PO CAPS: 1000.0000 [IU] | ORAL_CAPSULE | Freq: Every day | ORAL | Status: DC

## 2010-10-03 MED ORDER — AMLODIPINE BESYLATE 10 MG PO TABS: 10.0000 mg | ORAL_TABLET | Freq: Every day | ORAL | Status: DC

## 2010-10-03 MED ORDER — ATORVASTATIN CALCIUM 40 MG PO TABS: 40.0000 mg | ORAL_TABLET | Freq: Every day | ORAL | Status: DC

## 2010-10-03 MED ORDER — VALSARTAN-HYDROCHLOROTHIAZIDE 320-12.5 MG PO TABS: 1.0000 | ORAL_TABLET | Freq: Every day | ORAL | Status: DC

## 2010-10-03 MED ORDER — ASPIRIN 81 MG PO TABS: 81.0000 mg | ORAL_TABLET | ORAL | Status: AC

## 2010-10-03 NOTE — Assessment & Plan Note (Signed)
Summary: COUGHING   Vital Signs:  Patient Profile:   77 Years Old Female CC:      Cough Height:     63 inches Weight:      138 pounds O2 Sat:      98 % O2 treatment:    Room Air Temp:     98.4 degrees F oral Pulse rate:   90 / minute Pulse rhythm:   regular Resp:     15 per minute BP sitting:   148 / 82  (left arm)  Pt. in pain?   no  Vitals Entered By: Standley Dakins MD (September 26, 2010 1:08 PM)                   Current Allergies (reviewed today): SULFAHistory of Present Illness History from: patient Reason for visit: see chief complaint Chief Complaint: Cough History of Present Illness: The patient presented today because she has had 2 weeks of cough, congestion, SOB, nighttime wheezing, constant clear rhinorrhea and barking cough.  She saw her PCP last week and diagnosed with an URI.  She says that symptoms became progressively worse over the next few days and she came in today for evaluation.  She is coughing up thick white, yellow sputum.  She has a history of emphysema and lung cancer, status post right lung lobectomy.  She reports that the cancer was removed.  She says that she is not having any blood in the sputum.  No fever but is having chills and weight loss, poor appetite, no nausea or vomiting.  No CP.    She is coughing all night long and wheezing.  She is using an unknown inhaler medication.  The patient is a very poor historian.   No one came with her today to provide additional medical history.  Pt did not bring her medications today.    REVIEW OF SYSTEMS Constitutional Symptoms       Complains of chills and fatigue.     Denies fever, night sweats, weight loss, and weight gain.  Eyes       Denies change in vision, eye pain, eye discharge, glasses, contact lenses, and eye surgery. Ear/Nose/Throat/Mouth       Complains of frequent runny nose and sinus problems.      Denies hearing loss/aids, change in hearing, ear pain, ear discharge, dizziness, frequent nose  bleeds, sore throat, hoarseness, and tooth pain or bleeding.  Respiratory       Complains of dry cough, productive cough, wheezing, shortness of breath, and emphysema/COPD.      Denies asthma and bronchitis.  Cardiovascular       Denies murmurs, chest pain, and tires easily with exhertion.    Gastrointestinal       Denies stomach pain, nausea/vomiting, diarrhea, constipation, blood in bowel movements, and indigestion. Genitourniary       Denies painful urination, kidney stones, and loss of urinary control. Neurological       Denies paralysis, seizures, and fainting/blackouts. Musculoskeletal       Denies muscle pain, joint pain, joint stiffness, decreased range of motion, redness, swelling, muscle weakness, and gout.  Skin       Denies bruising, unusual mles/lumps or sores, and hair/skin or nail changes.  Psych       Denies mood changes, temper/anger issues, anxiety/stress, speech problems, depression, and sleep problems.  Past History:  Family History: Last updated: 09/26/2010 Hypertension  Social History: Last updated: 09/26/2010 Pt is widowed; She has grown children who  check on her frequently.  She lives in [place], [place].  (+) tobacco Retired  Past Medical History: Emphysema Lung Cancer History Hypertension Diabetes Mellitus type 2 Diabetic Peripheral Polyneuropathy Hyperlipidemia Tobacco Use  Past Surgical History: Lobectomy - Right Lung - Lung CA (33% of right lung removed)  Family History: Hypertension  Social History: Pt is widowed; She has grown children who check on her frequently.  She lives in [place], [place].  (+) tobacco Retired Physical Exam General appearance: well developed, well nourished, no acute distress, loud barky frequent nonproductive cough;  Head: normocephalic, atraumatic Eyes: conjunctivae and lids normal Pupils: equal, round, reactive to light Ears: normal, no lesions or deformities Nasal: swollen red turbinates with  congestion Oral/Pharynx: pharyngeal erythema without exudate, uvula midline without deviation Neck: neck supple,  trachea midline, no masses Chest/Lungs: tight lungs bilateral; shallow BS bilat; after DuoNeb slightly better airmovement but not full BS; Dec BS RUL Heart: regular rate and  rhythm, no murmur Extremities: absent toenails bilateral 1st toes Neurological: grossly intact and non-focal Skin: no obvious rashes or lesions MSE: oriented to time, place, and person Assessment New Problems: DIABETES MELLITUS, TYPE II, CONTROLLED, W/NEURO COMPS (ICD-250.60) UNSPECIFIED ESSENTIAL HYPERTENSION (ICD-401.9) TOBACCO ABUSE, HX OF (ICD-V15.82) EMPHYSEMA (ICD-492.8) OBSTRUCTIVE CHRONIC BRONCHITIS WITH EXACERBATION (ICD-491.21)   Patient Education: Patient and/or caregiver instructed in the following: rest, fluids, quit smoking. The risks, benefits and possible side effects were clearly explained and discussed with the patient.  The patient verbalized clear understanding.  The patient was given instructions to return if symptoms don't improve, worsen or new changes develop.  If it is not during clinic hours and the patient cannot get back to this clinic then the patient was told to seek medical care at an available urgent care or emergency department.  The patient verbalized understanding.   Demonstrates willingness to comply.  Plan New Medications/Changes: GUIATUSS AC 100-10 MG/5ML SYRP (GUAIFENESIN-CODEINE) take 1 teaspoon by mouth every 6 hours as needed severe cough:  Caution Will Cause Drowsiness  #6 oz x 0, 09/26/2010, Clanford Johnson MD FLUTICASONE PROPIONATE 50 MCG/ACT SUSP (FLUTICASONE PROPIONATE) 2 sprays per nostril once daily  #1 x 1, 09/26/2010, Clanford Johnson MD VENTOLIN HFA 108 (90 BASE) MCG/ACT AERS (ALBUTEROL SULFATE) 2 puffs inh every 4 hours as needed cough, wheezing, SOB  #1 x 0, 09/26/2010, Clanford Johnson MD LORATADINE 10 MG TABS (LORATADINE) take 1 by mouth daily for  allergies  #30 x 0, 09/26/2010, Clanford Johnson MD PREDNISONE (PAK) 10 MG TABS (PREDNISONE) take dose pack as directed  #21 x 0, 09/26/2010, Clanford Johnson MD DOXYCYCLINE HYCLATE 100 MG TABS (DOXYCYCLINE HYCLATE) take 1 by mouth two times a day with food until completed  #20 x 0, 09/26/2010, Standley Dakins MD  Planning Comments:   The patient was counseled and advised to stop using all tobacco products.  Medical assistance was offered and the patient was encouraged to call 1-800-QUIT-NOW to get a smoking cessation coach.     The patient was sent to [facility] to have a CXR PA/LAT performed.   Follow Up: Follow up in 2-3 days if no improvement, Follow up on an as needed basis, Follow up with Primary Physician  The patient and/or caregiver has been counseled thoroughly with regard to medications prescribed including dosage, schedule, interactions, rationale for use, and possible side effects and they verbalize understanding.  Diagnoses and expected course of recovery discussed and will return if not improved as expected or if the condition worsens. Patient and/or caregiver verbalized understanding.  Prescriptions: GUIATUSS AC 100-10 MG/5ML SYRP (GUAIFENESIN-CODEINE) take 1 teaspoon by mouth every 6 hours as needed severe cough:  Caution Will Cause Drowsiness  #6 oz x 0   Entered and Authorized by:   Standley Dakins MD   Signed by:   Standley Dakins MD on 09/26/2010   Method used:   Printed then faxed to ...       [facility] [address] (retail)       [address], [address]       [place], [place]  [address]       Ph: [phone]       Fax: [phone]   RxID:   [id number] FLUTICASONE PROPIONATE 50 MCG/ACT SUSP (FLUTICASONE PROPIONATE) 2 sprays per nostril once daily  #1 x 1   Entered and Authorized by:   Standley Dakins MD   Signed by:   Standley Dakins MD on 09/26/2010   Method used:   Electronically to        [facility] [address]  (retail)       [address], [address]       [place], [place]  [address]       Ph: [phone]       Fax: [phone]   RxID:   [id number] VENTOLIN HFA 108 (90 BASE) MCG/ACT AERS (ALBUTEROL SULFATE) 2 puffs inh every 4 hours as needed cough, wheezing, SOB  #1 x 0   Entered and Authorized by:   Standley Dakins MD   Signed by:   Standley Dakins MD on 09/26/2010   Method used:   Electronically to        [facility] [address] (retail)       [address], [address]       [place], [place]  [address]       Ph: [phone]       Fax: [phone]   RxID:   [id number] LORATADINE 10 MG TABS (LORATADINE) take 1 by mouth daily for allergies  #30 x 0   Entered and Authorized by:   Standley Dakins MD   Signed by:   Standley Dakins MD on 09/26/2010   Method used:   Electronically to        [facility] [address] (retail)       [address], [address]       [place], [place]  [address]       Ph: [phone]       Fax: [phone]   RxID:   [id number] PREDNISONE (PAK) 10 MG TABS (PREDNISONE) take dose pack as directed  #21 x 0   Entered and Authorized by:   Standley Dakins MD   Signed by:   Standley Dakins MD on 09/26/2010   Method used:   Electronically to        [facility] [address] (retail)       [address], [address]       [place], [place]  [address]       Ph: [phone]       Fax: [phone]   RxID:   [id number] DOXYCYCLINE HYCLATE 100 MG TABS (DOXYCYCLINE HYCLATE) take 1 by mouth two times a day with food until completed  #20  x 0   Entered and Authorized by:   Standley Dakins MD   Signed by:   Standley Dakins MD on 09/26/2010   Method used:   Electronically to        [facility] [address] (retail)       [address], [address]       [place], [place]  [address]       Ph: [phone]       Fax:  [phone]   RxID:   [id number]   Patient Instructions: 1)  Go to get your xrays done.  Please call us at [phone] if you have not heard about your results in 3 days.   2)  Go to the pharmacy and pick up your prescription (s).  It may take up to 30 mins for electronic prescriptions to be delivered to the pharmacy.  Please call if your pharmacy has not received your prescriptions after 30 minutes.   3)  Tobacco is very bad for your health and your loved ones! You Should stop smoking!. 4)  Stop Smoking Tips: Choose a Quit date. Cut down before the Quit date. decide what you will do as a substitute when you feel the urge to smoke(gum,toothpick,exercise). 5)  Check your blood sugars regularly. Your blood sugars may be elevated while taking prednisone (steroid).  Please check your blood sugar more frequently.  If your readings are usually above 250  or below 70 you should contact our office. 6)  It is important that your Diabetic A1c level is checked every 3 months. 7)  See your eye doctor yearly to check for diabetic eye damage. 8)  Check your feet each night for sore areas, calluses or signs of infection. 9)  Check your Blood Pressure regularly. If it is above: 140/90 you should make an appointment. 10)  Return or go to the ER if no improvement or symptoms getting worse.   11)  The patient was informed that there is no on-call provider or services available at this clinic during off-hours (when the clinic is closed).  If the patient developed a problem or concern that required immediate attention, the patient was advised to go the the nearest available urgent care or emergency department for medical care.  The patient verbalized understanding.

## 2010-10-03 NOTE — Progress Notes (Signed)
  Subjective:    Patient ID: Marissa Ho, female    DOB: [birth year], 77 y.o.   MRN: [id number]  CC: new patient, establish  HPI  Previously saw Dr. Kirby Funk.  Received flu shot last year, this past week had the flu.  Saw Dr. Valentina Lucks, felt he didn't help.  So went to [facility], given six prescriptions.  Concern for PNA but CXR clear per pt report.  Treated for COPD exac with doxy, prednisone.  Now breathing better.  Started on alb which seems to have helped.  Smoking - 1/2 ppd.  Quit in past.    H/o lung surgery, h/o kidney cancer.  Lung cancer free for 5 years.  Sole kidney.  Concerned about forgetting to take meds.  Lives by herself.  Widowed.  Husband died Dec 08, 2003.  Preventative: Thinks gets physical exams in July. Mammogram - 12/08/10, normal. Pap smear - 12/07/1980, s/p hysterectomy 1977 Colonoscopy - declines.  Had one before Dec 08, 2003, looked ok.  Doesn't want anymore. Has had pneumonia and flu shot.   Had tetanus shot, 12-07-09? Had shingles shot.  Review of Systems  Constitutional: Positive for fever. Negative for chills, activity change, appetite change, fatigue and unexpected weight change.  HENT: Negative for neck pain.   Eyes: Negative for visual disturbance.  Respiratory: Positive for cough and shortness of breath. Negative for chest tightness and wheezing.   Cardiovascular: Negative for chest pain and leg swelling.  Gastrointestinal: Negative for nausea, vomiting, abdominal pain, diarrhea, constipation, blood in stool and abdominal distention.  Genitourinary: Negative for hematuria and difficulty urinating.  Musculoskeletal: Negative for arthralgias.  Skin: Negative for color change and rash.  Neurological: Negative for syncope and headaches.       + neuropathy  Psychiatric/Behavioral: Negative for dysphoric mood. The patient is not nervous/anxious.        Objective:   Physical Exam  Nursing note and vitals reviewed. Constitutional: She is oriented to person, place, and  time. She appears well-developed and well-nourished.  HENT:  Head: Normocephalic and atraumatic.  Right Ear: Tympanic membrane and external ear normal.  Ears:  Nose: Nose normal.  Mouth/Throat: Oropharynx is clear and moist. She has dentures (complete upper, partial lower).       L cerumen impaction, unable to visualize TM  Eyes: Conjunctivae and EOM are normal. Pupils are equal, round, and reactive to light.  Neck: Normal range of motion. Neck supple. No thyromegaly present.  Cardiovascular: Normal rate, regular rhythm, normal heart sounds and intact distal pulses.   Pulses:      Radial pulses are 2+ on the right side, and 2+ on the left side.  Pulmonary/Chest: Effort normal and breath sounds normal. She has no wheezes. She has no rales.  Abdominal: Soft. Bowel sounds are normal. She exhibits no distension and no mass. There is no tenderness. There is no rebound and no guarding.  Musculoskeletal: Normal range of motion.  Lymphadenopathy:    She has no cervical adenopathy.  Neurological: She is alert and oriented to person, place, and time. No sensory deficit.       CN grossly intact, station and gait intact. Mild athetoid mouth movement  Skin: Skin is warm and dry.  Psychiatric: She has a normal mood and affect. Her behavior is normal. Judgment and thought content normal.          Assessment & Plan:

## 2010-10-03 NOTE — Assessment & Plan Note (Deleted)
Refilled meds per pt request. Await records from prior pcp.

## 2010-10-03 NOTE — Assessment & Plan Note (Signed)
Review records from prior pcp. Brings log of sugars ranging from 80s to high 100s (Fasting)

## 2010-10-03 NOTE — Assessment & Plan Note (Signed)
Refilled meds per pt request. Await records from prior PCP

## 2010-10-03 NOTE — Assessment & Plan Note (Signed)
Review records.  Brings log of sugars, ranging 80s to high 100s (fasting)

## 2010-10-03 NOTE — Assessment & Plan Note (Signed)
Refilled atorvastatin per pt request.

## 2010-10-03 NOTE — Assessment & Plan Note (Signed)
Discussed importance of quitting, esp in settiong of h/o lung/ kidney CA.

## 2010-10-03 NOTE — Patient Instructions (Addendum)
I will obtain records from Dr. Valentina Lucks and review. Please return in 1-2 months for follow up.  We may draw blood then. You need to quit smoking!! Let us know if any problems with the left ear. Medicines refilled and sent to [facility] with 3 mo supply. Restart baby aspirin, maybe every other day or 3 times a week. Good to meet you today, call clinic with questions

## 2010-10-03 NOTE — Assessment & Plan Note (Signed)
Continue gabapentin States recently started on desipramine for neuropathy as well.

## 2010-10-12 NOTE — Medication Information (Signed)
Summary: prescription  prescription   Imported By: Eugenio Hoes 10/06/2010 12:59:36  _____________________________________________________________________  External Attachment:    Type:   Image     Comment:   External Document

## 2010-10-12 NOTE — Medication Information (Signed)
Summary: med list  med list   Imported By: Eugenio Hoes 10/06/2010 12:57:16  _____________________________________________________________________  External Attachment:    Type:   Image     Comment:   External Document

## 2010-10-12 NOTE — Letter (Signed)
Summary: radiologist report  radiologist report   Imported By: Eugenio Hoes 10/06/2010 12:58:49  _____________________________________________________________________  External Attachment:    Type:   Image     Comment:   External Document

## 2010-10-12 NOTE — Letter (Signed)
Summary: History form   History form   Imported By: Eugenio Hoes 10/06/2010 12:55:48  _____________________________________________________________________  External Attachment:    Type:   Image     Comment:   External Document

## 2010-10-24 ENCOUNTER — Ambulatory Visit (INDEPENDENT_AMBULATORY_CARE_PROVIDER_SITE_OTHER): Payer: Medicare Other | Admitting: Family Medicine

## 2010-10-24 ENCOUNTER — Encounter: Payer: Self-pay | Admitting: Family Medicine

## 2010-10-24 VITALS — BP 140/70 | HR 88 | Temp 97.8°F | Wt 148.1 lb

## 2010-10-24 DIAGNOSIS — Z720 Tobacco use: Secondary | ICD-10-CM

## 2010-10-24 DIAGNOSIS — F17200 Nicotine dependence, unspecified, uncomplicated: Secondary | ICD-10-CM

## 2010-10-24 DIAGNOSIS — R05 Cough: Secondary | ICD-10-CM

## 2010-10-24 DIAGNOSIS — R059 Cough, unspecified: Secondary | ICD-10-CM

## 2010-10-24 MED ORDER — HYDROCODONE-ACETAMINOPHEN 5-500 MG PO TABS: 1.0000 | ORAL_TABLET | Freq: Every day | ORAL | Status: DC

## 2010-10-24 MED ORDER — GLUCOSE BLOOD VI STRP: ORAL_STRIP | Status: AC

## 2010-10-24 MED ORDER — GUAIFENESIN-CODEINE 100-10 MG/5ML PO SYRP: 5.0000 mL | ORAL_SOLUTION | Freq: Two times a day (BID) | ORAL | Status: DC | PRN

## 2010-10-24 NOTE — Assessment & Plan Note (Signed)
Slowly decreasing.  Discussed sandwich bag method.  Pt seems motivated to quit.

## 2010-10-24 NOTE — Progress Notes (Signed)
  Subjective:    Patient ID: Marissa Ho, female    DOB: Jan 20, 1932, 77 y.o.   MRN: [id number]  HPI CC: cough  H/o RU lobectomy 2009 for lung CA, COPD/emphysema and continued smoking but has backed down.  Last week used clorox bleach straight to clean blinds in kitchen.  After this noted clear drainage from nose and since then has had increase in cough.  Nonproductive.  No SOB.  No wheezing.  No burning or pain.  Sugar free cough drops do help some.    No fevers/chills.  Down to 5-6 cigarettes/day.  Previously smoking 1/2 ppd.  Motivated to quit.    Uses vicodin for feet at night, requests refill.  Already on gabapentin high dose.  Review of Systems Per HPI    Objective:   Physical Exam  Vitals reviewed. Constitutional: She appears well-developed and well-nourished. No distress.  HENT:  Head: Normocephalic and atraumatic.  Mouth/Throat: Oropharynx is clear and moist. No oropharyngeal exudate.       Some nasal irritation bilaterally  Eyes: Conjunctivae are normal. Pupils are equal, round, and reactive to light.  Cardiovascular: Normal rate, regular rhythm, normal heart sounds and intact distal pulses.   No murmur heard. Pulmonary/Chest: Effort normal and breath sounds normal. No respiratory distress. She has no wheezes. She has no rales.          Assessment & Plan:

## 2010-10-24 NOTE — Progress Notes (Signed)
Addended by: Eustaquio Boyden on: 10/24/2010 05:51 PM   Modules accepted: Orders

## 2010-10-24 NOTE — Patient Instructions (Signed)
For cough - cheratussin during day. Refilled hydrocodone for night time. Don't use both meds at same time as they are same family and can cause over sedation. Nasal saline irrigation to nose for next few weeks to help with cough as well as wash away any residual chlorox

## 2010-10-24 NOTE — Assessment & Plan Note (Signed)
Likely irritant cough from base fume exposure.  Recommended nasal saline irrigation.  >24 hours since exposure.  Lungs clear today.  Return if not improved.  cheratussin for cough at night.  Could consider tessalon perls.

## 2010-10-26 ENCOUNTER — Other Ambulatory Visit: Payer: Self-pay | Admitting: Family Medicine

## 2010-11-06 ENCOUNTER — Encounter: Payer: Self-pay | Admitting: Family Medicine

## 2010-11-07 ENCOUNTER — Encounter: Payer: Self-pay | Admitting: Family Medicine

## 2010-11-28 NOTE — Assessment & Plan Note (Signed)
OFFICE VISIT   Marissa Ho, Marissa Ho  DOB:  06/03/1932                                        November 06, 2007  CHART #:  [id number]   Marissa Ho returns today for her 85-month follow-up visit.  She under  went CT scan on October 21, 2007, of the chest and abdomen.  She was seen  by Dr. Vernie Ammons yesterday, November 05, 2007.  The patient states Dr.  Vernie Ammons gave her clearance.  She states he told her everything was going  well.  The patient is without complaints.  Denies any worsening  symptoms.  She is up ambulating well with mild dyspnea.   PHYSICAL EXAMINATION:  VITAL SIGNS:  Blood pressure of 120/62, pulse 72,  respirations of 18.  O2 sat 96% on room air.  RESPIRATORY:  Clear to  auscultation bilaterally.  CARDIAC:  Regular rate and rhythm.   STUDIES:  The patient had a CT of the chest and abdomen on October 21 2007, showing stable to slight decrease in the nodular density at the  right lung base measuring 10 x 5 mm, which was 12 x 16 mm on prior exam.  There is a question of slight increased prominence of some right hilar  lymph nodes when compared to the prior exam.  The abdomen CT showed  stable lymph nodes in the porta hepatis, pericaval and periaortic  regions.  No new lymph nodes and stable cyst to the right kidney with  fatty change of the liver.   IMPRESSION AND PLAN:  The patient was seen and evaluated by Dr. Edwyna Shell.  The patient is doing well.  CT scan evaluated.  The patient discussed  there is a good sign that the nodular  density is decreasing, and this is probably scar tissue from her VATS  done.  Plan to follow up with the patient in 6 months with a chest x-  ray.  The patient agrees.   Ines Bloomer, M.D.  Electronically Signed   KMD/MEDQ  D:  11/06/2007  T:  11/06/2007  Job:  [id number]   cc:   Ines Bloomer, M.D.

## 2010-11-28 NOTE — Assessment & Plan Note (Signed)
OFFICE VISIT   Marissa Ho, Marissa Ho  DOB:  April 18, 1932                                        May 05, 2008  CHART #:  [id number]   The patient came for followup today.  She is now 2-1/2 years since her  surgery.  Her blood pressure is 110/60, pulse 70, respirations 20, and  sats were 97%.  Her lungs were clear to auscultation and percussion.  She says she has gained weight.  Chest x-ray showed status post right  upper lobectomy and they thought there is increased density in the right  base.  He is scheduled to have a CT scan by Dr. Vernie Ammons, hopefully, in  the near future if she does not get a CT scan with Dr. Vernie Ammons, and we  will plan to repeat her CT scan about 3 months earlier than usual  because of the increased density but that was still be approximately 3  years since her last surgery, and I will see her back in 3 months with a  CT scan.   Ines Bloomer, M.D.  Electronically Signed   DPB/MEDQ  D:  05/05/2008  T:  05/05/2008  Job:  [id number]

## 2010-11-28 NOTE — Letter (Signed)
May 13, 2007   Mark C. Vernie Ammons, M.D.  [address]  [place], [place] [address]   Re:  LOUIS, GAW              DOB:  1931-08-21   Dear Dr. Vernie Ammons:   This patient had a previous transitional cell carcinoma with a left  nephrectomy and then on August 03, 2005, had an adenocarcinoma of the  right upper lobe, in which she underwent a resection.  She now returns  for followup for several small nodules in the lung.  Her last CT scan  was 6 months ago.  We got a CT scan today that showed that the nodule  deep in the right base may be slightly larger.  The other small nodules  in the right side and one of the left appear to be unchanged.  Because  of her multiple lung nodules, we need to follow with 61-month CTs, so I  have scheduled another CT scan in 6 months.   Her blood pressure is 143/79, pulse 64, respirations 18 and SATs are  94%.   Ines Bloomer, M.D.  Electronically Signed   DPB/MEDQ  D:  05/13/2007  T:  05/14/2007  Job:  [id number]

## 2010-11-28 NOTE — Assessment & Plan Note (Signed)
OFFICE VISIT   Marissa Ho, Marissa Ho  DOB:  08-Nov-1931                                        February 22, 2010  CHART #:  [id number]   HISTORY:  The patient is status post right VATS with right thoracotomy  and right upper lobectomy done by Dr. Edwyna Shell on August 03, 2005.  This  was positive for adenocarcinoma.  Dr. Edwyna Shell has been following the  patient.  The patient with repeat CT scan.  She did have some nodules  noted on the CT scan in January 2010.  He had been following her every 6  months with repeat CT scan.  Since these nodules were noted, they have  remained stable.  No recurrence of cancer has been noted.  She presents  back today for a followup visit with repeat CT scan.  The patient is  without complaints today.  He denies any shortness of breath, chest  pains, hemoptysis, wheezing.   PHYSICAL EXAMINATION:  Vital Signs:  Blood pressure 139/57, pulse of 94,  respirations of 18, O2 sats 93% on room air.  Respiratory:  Clear to  auscultation bilaterally.  Cardiac:  Regular rate and rhythm.   CT scan done today August 10 of the chest shows no new or enlarging  pulmonary nodules.  Previously noted small lung nodules are stable  consistent with benign process.  Also states that the two foci noted in  the right upper lobe on the prior study posteriorly or less prominent.   IMPRESSION AND PLAN:  Marissa Ho was seen and evaluated by Dr. Edwyna Shell.  Her CT scan results were discussed with her.  The patient shows no  recurrence of cancer at this time, and will  plan to repeat another chest CT scan in 6 months.  The patient is told  the interim if she has any questions, she is to contact us.  She is in  agreement.   Marissa Ho, M.D.  Electronically Signed   KMD/MEDQ  D:  02/22/2010  T:  02/23/2010  Job:  [id number]   cc:   Marissa Ho, M.D.  Marissa Ho, M.D.

## 2010-11-28 NOTE — Letter (Signed)
August 04, 2008   Mark C. Vernie Ammons, MD  [address]  [place], [place] [address]   Re:  Marissa Ho, Marissa Ho              DOB:  Jun 01, 1932   Dear Loraine Leriche,   I saw the patient back today and we got a CT scan and her CT scan showed  no evidence of recurrence of her cancer.  There were some of the nodules  that were possibly more prominent than they were approximately 2 years  ago.  Since she has so many areas of scar and then nodularity in her  lungs, it is hard to tell, but essentially there has been no great  change.  For this reason, we will just see her back again in 6 months  with another CT scan.  Her blood pressure was 156/85, pulse 76,  respirations 20, and sats were 97%.  Her lungs are clear to auscultation  and percussion.  The patient did state that if she does have recurrence,  she would probably not refuse any further therapy.   Ines Bloomer, M.D.  Electronically Signed   DPB/MEDQ  D:  08/04/2008  T:  08/04/2008  Job:  [id number]

## 2010-11-28 NOTE — Letter (Signed)
July 19, 2009   Thora Lance, MD  [address]  [place], [place] [address]   Re:  Ho, Marissa              DOB:  07/10/1932   Dear Dr. Valentina Lucks:   I saw the patient in the office today and her CT scan showed no evidence  of recurrence of her cancer.  The areas that we were worried about are  stable.  The recommendation of this is to continue to watch these with 6-  month CT scans.  She is now 3 years since her surgery.  I plan to see  her back again in 6 months.  We will repeat her CT scan also.   Sincerely,   Ines Bloomer, M.D.  Electronically Signed   DPB/MEDQ  D:  07/19/2009  T:  07/19/2009  Job:  [id number]   cc:   Veverly Fells. Vernie Ammons, M.D.

## 2010-11-28 NOTE — Assessment & Plan Note (Signed)
OFFICE VISIT   Marissa, Ho  DOB:  [birth year]                                        February 02, 2009  CHART #:  [id number]   The patient came for followup today.  Her blood pressure was 130/64,  pulse 72, respirations 18, and saturations were 96%.  She has had a  recent death in her family and was upset about that.  Her chest CT scan  showed no evidence of recurrence with the nodules all being stable.  We  will continue to follow her.  She is now over 3 years since her surgery  and I will see her back again in 6 months with another CT scan.   Ines Bloomer, M.D.  Electronically Signed   DPB/MEDQ  D:  02/02/2009  T:  02/03/2009  Job:  [id number]

## 2010-12-01 NOTE — Discharge Summary (Signed)
[facility]  Patient:    Marissa Ho, Marissa Ho Visit Number: [id number] MRN: [id number]          Service Type: SUR Location: 3W 0378 01 Attending Physician:  Trisha Mangle Dictated by:   Veverly Fells Vernie Ammons, M.D. Admit Date:  11/14/2001 Discharge Date: 11/19/2001   CC:         Thora Lance, M.D.   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  OTHER DIAGNOSES: 1. Borderline diabetes. 2. Hypertension. 3. Hyperlipidemia. 4. Degenerative disk disease. 5. History of tobacco use.  MAJOR OPERATION:  Left nephroureterectomy.  DISCHARGE MEDICATIONS: 1. Lipitor 20 mg q.d. 2. Atacand/HCTZ 16/25 mg q.d. 3. Vitamin D. 4. Aspirin 81 mg. 5. Tylox for pain, #40. 6. Colace 100 mg q.12h., #30. 7. Cipro 250 mg b.i.d., #14.  DISPOSITION:  The patient is discharged home in a stable, satisfactory, and improved condition.  FOLLOWUP:  In my office in one week for catheter and staple removal.  ACTIVITY:  Limited to no heavy lifting, vigorous activity, or driving.  I have also recommended she not return to work for approximately three to four weeks.  DIAGNOSTIC DATA:  Her pathology revealed low-grade papillary transitional cell carcinoma, grade II with no invasions and premargins, giving her a pTa, PNX, PMX lesion with no evidence of metastatic spread.  DIET:  Reduced simple sugars.  BRIEF HISTORY:  This 77 year old white female was seen for gross hematuria and found to have a mass in the upper pole of her left kidney by IVP and blood emanating from her left ureteral orifice.  She underwent retrograde pyelogram which confirmed what appeared to be a mass that occupied the collecting system of the left upper pole.  Cytologies were suspicious for carcinoma, and she was admitted for left nephrectomy with likely nephroureterectomy.  The remainder of her History & Physical will not be dictated at this time.  HOSPITAL  COURSE:   The patient was admitted on Nov 14, 2001, and taken to the operating room where she underwent a left nephroureterectomy and adrenalectomy without complication.  She left the OR with her Foley catheter indwelling. She required no intraoperative transfusions.  The night of her surgery, she was noted to be groggy but without complaints.  Her urine was slightly pink but had no clots and was draining.  Her O2 saturation was 92% on 4 liters O2.  The following morning, she had remained afebrile, and had some incisional pain but was advanced to a clear liquid diet and was tolerating this well.  The following day, her urine remained clear with good output.  Her morphine PCA was stopped, and she was started on oral analgesics.  She had not passed any flatus but had not complained of any nausea at that time.  By her third postoperative day, she did begin to complain of some mild nausea, had no BM, and her incisions looked good with no evidence of ileus.  BMP was checked, and she was started on some laxatives and continue ambulation.  Her BMP returned with sodium slightly low at 132.  Glucose was 133, creatinine 1.2.  By her fourth postoperative day, she said she just did not feel like she was ready to go home, but otherwise she seemed to be doing well, still had not had a bowel movement, but had had some flatus.  With further laxatives, her bowels began to move.  She was without any complaint at the time of her discharge. No  nausea and incisions healing well.  She will be discharged home with a leg bag with instructions on its use.  I have discussed the results of her pathology with her.  She will, therefore, be discharged home at this time. Dictated by:   Veverly Fells Vernie Ammons, M.D. Attending Physician:  Trisha Mangle DD:  11/19/01 TD:  11/21/01 Job: [id number]

## 2010-12-01 NOTE — Op Note (Signed)
[facility]  Patient:    Marissa Ho, Marissa Ho Visit Number: [id number] MRN: [id number]          Service Type: SUR Location: 3W 0378 01 Attending Physician:  Trisha Mangle Dictated by:   Veverly Fells Vernie Ammons, M.D. Proc. Date: 11/14/01 Admit Date:  11/14/2001                             Operative Report  PREOPERATIVE DIAGNOSIS:  Left renal mass.  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  PROCEDURE:  Left nephroureterectomy.  SURGEON:  Mark C. Vernie Ammons, M.D.  ASSISTANT:  Lindaann Slough, M.D.  ANESTHESIA:  General endotracheal.  SPECIMENS:  Left kidney, ureter, and adrenal gland to pathology.  DRAINS:  20 French Foley catheter.  ESTIMATED BLOOD LOSS:  Approximately 150 cc.  COMPLICATIONS:  None.  INDICATIONS FOR PROCEDURE:  The patient is a 77 year old white female with a solid left renal mass on CT, no evidence of metastatic spread and the lesion was noted to be 4 cm in size primarily located within the collecting system on the left hand side by CT and retrograde pyelogram with cytology suspicious for transitional cell carcinoma. The risks, complications, and alternatives were discussed with the patient and are fully outlined in my office notes which have been placed on the chart.  DESCRIPTION OF PROCEDURE:   After informed consent, the patient brought to the major OR and placed on the table in the supine position, administered general endotracheal anesthesia. She is then moved to partial lateral flank position with the left flank up and the pelvis flat on the table. A Foley catheter was placed in the bladder and her left flank was then sterilely prepped and draped. The subcostal incision was then made, carried down through the fascial and muscular layers and the retroperitoneum was entered bluntly. I then dissected the peritoneum off of the under surface of the muscle bellies and then dissected between the  peritoneum and Gerotas fascia down to the level of the ureter. The ureter was identified, clipped, tied and then divided. I dissected up the ureter to the level of the hilum where I identified the renal vein which was cleared and a #1 silk suture was placed around the vein. I then used this to retract the vein inferiorly and dissected out the artery. I then placed 2-0 silk ties proximally on the artery and one distally and divided the artery. The vein was treated in identical fashion and the remaining lymphatics were clipped and divided. I then completed the dissection of the kidney by using a combination of blunt and sharp technique and clips on all vessels noted. The specimen was then freed and sent to pathology for frozen section. While I awaited frozen section since the tumor was located in the upper pole, I proceeded was left adrenalectomy.  The adrenal gland was identified and I dissected out the prominent adrenal vein, it was doubly tied and divided with silk sutures. I then isolated small vessels that were feeding the gland, clipped these proximally and divided them freeing the gland and sending this to pathology separately.  Frozen section of the kidney revealed transitional cell carcinoma that it did not appear invasive. I therefore closed the flank incision in two layers closing the internal and external oblique fascia with running #1 PDS suture and copiously irrigating the subcu tissue and then closing the skin with skin staples.  Attention was directed to  the pelvis where a Hollice Espy type incision was then made. I identified the lateral aspect of the fascia and incised along the lines of the fascia in order to remain extraperitoneal. Blunt dissection was then undertaken. I identified the inferior epigastric artery and vein and tied these with silk suture and divided them. A retractor was then inserted to afford exposure. I identified the ureter and then dissected this down to  the level of the bladder noting the small vessels feeding the ureter and clipping these before dividing them. I then dissected the ureter into the detrusor muscle and with gentle traction was able to identify the mucosa of the bladder. I therefore placed a holding suture in the anterior aspect of the mucosa and incised that, identified the stent exiting the ureter through the ureteral orifice and then incised circumferentially around this and placed a second 2-0 silk suture in the posterior mucosal layer. I then fully freed up the ureter and sent this to pathology for permanent sectioning.  The bladder was then closed by first reapproximating the mucosa and muscularis in a running fashion with 2-0 chromic suture. A second 2-0 chromic was then used to embrocate the adventitial layer of the bladder and close the defect posteriorly. I then filled the bladder through the Foley catheter and noted no leakage. The bladder was then drained and the wound irrigated. I then closed the fascia with running #0 PDS suture, irrigated the subcu and closed the skin with skin staples. The original 31 French Foley catheter was then removed and a 20 [place] Foley with a 5 cc balloon was inserted in the bladder and the patient was awakened and taken to the recovery room in stable satisfactory condition. The patient tolerated the procedure well with no intraoperative complications. Sponge, needle and instrument counts were reportedly correct x2 at the end of the operation. Dictated by:   Veverly Fells Vernie Ammons, M.D. Attending Physician:  Trisha Mangle DD:  11/14/01 TD:  11/16/01 Job: [id number]

## 2010-12-01 NOTE — Discharge Summary (Signed)
NAME:  Marissa Ho, Marissa Ho                        ACCOUNT NO.:  [id number]   MEDICAL RECORD NO.:  [id number]                   PATIENT TYPE:  INP   LOCATION:  0359                                 FACILITY:  [facility]   PHYSICIAN:  Hinton Dyer, D.D.S.            DATE OF BIRTH:  24-Oct-1931   DATE OF ADMISSION:  04/28/2002  DATE OF DISCHARGE:  05/03/2002                                 DISCHARGE SUMMARY   ADMISSION DIAGNOSIS:  Severe facial cellulitis with associated airway  distress.   DISCHARGE DIAGNOSIS:  Severe facial cellulitis with associated airway  distress.   PROCEDURE WHILE IN THE HOSPITAL:  Incision and drainage and antibiotic  supportive care.   BRIEF HISTORY:  The patient was brought into the emergency room in  significant distress with a large facial cellulitis, dysphagia, and some  shortness of breath, right eye was partially shut and the swelling extended  down into the neck  A CT scan demonstrated pressure on the airway and  esophagus with some distortion of the trachea.   HOSPITAL COURSE:  The patient was taken to the operating room where an  incision and drainage was used to decompress the abscess and get a culture  and sensitivity.  C&S grew out gram-negative rods, gram-negative cocci, and  microaerophilic streptococcus.  An initial CBC showed a significant  elevation of the white blood cell count of 24.2, 80 neutrophils, 19.3  neutrophil ABS and monocyte ABS was 2.2.  Sodium was 134, glucose 125.  Urinalysis was essentially negative with an elevated specific gravity.  She  was cleared for surgery and underwent the procedure and was monitored  closely postoperatively.  She continued to do well with no respiratory  embarrassment and as the dysphagia disappeared was able to take fluids and  then ultimately food p.o.  She had a bowel movement while in the hospital  and has been ambulatory.  Her white blood cell count has fallen nicely down  to 16.3 on October 16th,  sodium was 132, and glucose was 129.  She was  monitored by infectious disease, her own medical doctor, Dr. Valentina Lucks, and  myself.  Today, the swelling is significantly lower than yesterday with some  drainage still present at the drain site.  Dysphagia has resolved, she is  eating nicely and is ambulatory.  She has been afebrile for 24 hours and has  been off of IV antibiotics and taking antibiotics by mouth.  Therefore, we  are sending her home with a discharge diagnosis of severe facial cellulitis  with respiratory embarrassment.   DISPOSITION:  She is being discharged in good condition.   DISCHARGE MEDICATIONS:  She is being discharged with a prescription for  Augmentin 875 mg, dispense #30, one b.i.d. and Vicodin 1-2 q.4 h. p.r.n.  pain.   FOLLOW UP:  She is being instructed to come back to my office tomorrow for  evaluation and checking of the drain  site and possible pulling of the drain.    DISCHARGE INSTRUCTIONS:  Diet, home care instructions were given as well as  care of the drain.  As I said, she is being discharged in good condition.                                               Hinton Dyer, D.D.S.    [name]  D:  05/03/2002  T:  05/04/2002  Job:  [id number]   cc:   Thora Lance, M.D.  [address] [place]  [place] [address]  Fax: [phone]   Fransisco Hertz, M.D.  [address]  [place] [address]  Fax: [phone]

## 2010-12-01 NOTE — Op Note (Signed)
NAME:  Marissa Ho, Marissa Ho              ACCOUNT NO.:  [id number]   MEDICAL RECORD NO.:  [id number]          PATIENT TYPE:  INP   LOCATION:  2550                         FACILITY:  [facility]   PHYSICIAN:  Ines Bloomer, M.D. DATE OF BIRTH:  11/25/1931   DATE OF PROCEDURE:  DATE OF DISCHARGE:                                 OPERATIVE REPORT   PREOPERATIVE DIAGNOSIS:  Right upper lobe mass.   POSTOPERATIVE DIAGNOSIS:  Right upper lobe mass.   OPERATION PERFORMED:  Right VATS, right thoracotomy, right upper lobectomy  with node dissection.   SURGEON:  D.P. Edwyna Shell, M.D.   ANESTHESIA:  General.   After percutaneous insertion of all monitoring lines, the patient underwent  general anesthesia and was prepped and draped in the usual sterile manner.  Two trocar sites were made in the anterior and posterior axillary line at  the seventh intercostal space.  A dual-lumen tube was inserted.  The lung  was eventually deflated.  The patient had marked evidence of chronic  obstructive pulmonary disease with tissue-paper lungs.  After __________  posterolateral thoracotomy was made over the fifth intercostal space, the  latissimus was partially divided.  The serratus was reflected anteriorly.  A  portion of the sixth rib was taken subperiosteally at the angle.  Two  __________  were placed at right angles __________  The cancer was seen in  the anterior segment of the right upper lobe.  It was close to the hilum.  We really could not do any type of a limited resection.  The patient had to  have a lobectomy to remove it.  It was approximately 2 cm in diameter.    Attention was turned to the superior pulmonary vein, and the apical  posterior branch was divided with the Autosuture 30 white Roticulator  This  exposed two arterial branches, the apical posterior and the anterior branch.  The apical posterior branch was ligated proximally and distally with 0 silk,  clipped and divided.  The anterior branch  was dissected up and stapled with  the Autosuture 30 white Roticulator.  Finally, the main portion of the  superior pulmonary vein to the upper lobe was stapled and divided with the  Autosuture 30 wide Roticulator.  Attention was then turned to the fissure.  The artery was dissected out in the fissure, and then we dissected the  posterior mediastinum and divided the superior portion of the major fissure  with the Echelon 60 stapler.  There was no posterior branch of the pulmonary  artery.  The bronchus was stapled and divided with the Autosuture 45 green  Roticulator.  The rest of the fissure was then divided with the echelon 60  stapler and the autosuture stapler.  The right upper lobe was removed.  The  right middle lobe was stapled to the right lower lobe to prevent torsion.  The inferior pulmonary ligament was taken down.  Some 9R nodes were removed.  The 4R nodes were removed.  Several 10R and 11R nodes were removed with the  resection.  Two chest tubes were brought in, a straight chest tube  through  the anterior chest and a right-angle chest tube through the posterior chest  tube site.  Marcaine block was done in the usual fashion.  The On-Q catheter  was tunneled posteriorly and put in place.  The sheath was removed  and then it was secured in place with Steri-Strips.  CoSeal was applied to  the staple line.  The chest was closed with four pericostals, #1 Vicryl in  the muscle layer, 2-0 Vicryl in the subcutaneous tissue and Ethicon skin  clips.  The patient returned to the recovery room in stable condition.           ______________________________  Ines Bloomer, M.D.     DPB/MEDQ  D:  08/03/2005  T:  08/03/2005  Job:  [id number]   cc:   Jonny Ruiz L. Rendall, M.D.  Fax: [phone]   Veverly Fells. Vernie Ammons, M.D.  Fax: [phone]

## 2010-12-01 NOTE — H&P (Signed)
[place]  Patient:    Marissa Ho, Marissa Ho                       MRN: [id number] Adm. Date:  11/07/00 Attending:  Thora Lance, M.D.                         History and Physical  CHIEF COMPLAINT:  Chest pain.  HISTORY OF PRESENT ILLNESS:  This is a 77 year old white female with a history of hypertension, hyperlipidemia, and tobacco use who last night developed mild pain and pressure in her central chest associated with left shoulder and arm pain.  This lasted all night at a low level.  This morning she went to work, and at work the pain became more severe and she developed shortness of breath. She went to her company nurse who said she looked pale.  She was given O2 by the nurse and her symptoms resolved within 10 minutes.  She has felt fine the rest of the day and comes into the office for evaluation of these complaints. She has not had chest pain in the past.  PAST MEDICAL HISTORY: 1. Hypertension. 2. Hyperlipidemia, on Zocor. 3. Tobacco use. 4. Neck degenerative disk disease.  SURGICAL: 1. TAH/USO in the 1970s for benign reasons. 2. Left breast lump, 1953. 3. Back surgery at L4-5, 1982. 4. Left eye laser surgery, 1985.  INJURIES:  Fractured left foot, 1999.  FAMILY HISTORY:  Father died age 49, heart disease.  Mother died age 16 due to hypertension and diabetes.  Brother has hypertension and coronary artery disease with a CABG in his 40s, also diabetic.  Sister hypertension, diabetes, and heart disease.  SOCIAL HISTORY:  She is married.  She smokes one-half to one pack of cigarettes a day for 50+ years.  Does not drink alcohol.  She works at [employer].  REVIEW OF SYSTEMS:  Otherwise negative.  PHYSICAL EXAMINATION:  GENERAL:  She looks well in the office in no distress.  VITAL SIGNS:  Blood pressure 122/54, heart rate 80, respirations 20, temperature 98.6.  HEENT:  Pupils are equal, round, respond to light.   Extraocular movements intact.  Tympanic membranes are clear.  Oral cavity and pharynx clear.  NECK:  Supple, no lymphadenopathy, no bruits.  LUNGS:  Clear.  HEART:  Regular rate and rhythm without murmur, gallop, rub.  ABDOMEN:  Soft, nontender, normal bowel sounds without mass or hepatosplenomegaly.  RECTAL AND PELVIC:  Deferred.  EXTREMITIES:  Showed no edema with normal pulses in the feet.  NEUROLOGIC:  Nonfocal exam.  LABORATORY DATA:  Pending.  EKG shows normal sinus rhythm and some lateral T wave inversions which were present on an old EKG.  ASSESSMENT:  Chest pain, rule out myocardial infarction or unstable angina pectoris.  She has multiple cardiac risk factors including hypertension, tobacco use, hyperlipidemia, and family history.  PLAN:  Admit.  Lovenox.  Aspirin.  Cardiac enzymes and serial EKG.  Cardiology consult. DD:  11/07/00 TD:  11/08/00 Job: [id number]

## 2010-12-01 NOTE — Discharge Summary (Signed)
NAME:  Marissa Ho, Marissa Ho              ACCOUNT NO.:  [id number]   MEDICAL RECORD NO.:  [id number]          PATIENT TYPE:  INP   LOCATION:  3304                         FACILITY:  [facility]   PHYSICIAN:  Ines Bloomer, M.D. DATE OF BIRTH:  07/07/1932   DATE OF ADMISSION:  08/03/2005  DATE OF DISCHARGE:  08/10/2005                                 DISCHARGE SUMMARY   PRIMARY ADMITTING DIAGNOSES:  1.  Right upper lobe lung mass.  2.  History of transitional cell renal carcinoma.   ADDITIONAL/DISCHARGE DIAGNOSES:  1.  Adenocarcinoma of the right lung.  2.  History of transitional cell renal carcinoma, status post left      nephrectomy.  3.  Hypertension.  4.  Hyperlipidemia.  5.  Chronic obstructive pulmonary disease.  6.  Postoperative respiratory insufficiency.   PROCEDURES PERFORMED:  Right video-assisted thoracic surgery, right  thoracotomy, right upper lobectomy with lymph node dissection.   HISTORY:  The patient is a 77 year old female with a history of left  nephrectomy for transitional cell carcinoma.  She recently underwent a CT  scan which showed a 1.8 x 1 severe nodule in the right upper lobe with some  mild mediastinal adenopathy.  She subsequently underwent a PET scan which  showed increased uptake in the area of the nodule.  She was subsequently  referred to Dr. Edwyna Shell for evaluation.  It was his opinion that she should  undergo a right VATS with either wedge resection or right upper lobectomy at  this time in order to obtain diagnosis.  He explained the risks, benefits  and alternatives of the procedure to the patient and she agreed to proceed  with surgery.   HOSPITAL COURSE:  She was admitted to [facility] on August 03, 2005 and was taken to the operating room, where she underwent a right upper  lobectomy performed by Dr. Edwyna Shell as described in detail above.  Intraoperative frozen sections were positive for adenocarcinoma, which was a  second primary  carcinoma.  She tolerated the procedure well and was  transferred to the floor in stable condition.  Postoperatively, she  experienced some mild respiratory insufficiency and was seen in consultation  by Pulmonary Critical Care Medicine.  She was treated conservatively with  incentive spirometry, IPPB and a flutter valve.  Her pulmonary status  improved and ultimately she was able to be weaned from supplemental oxygen.  Otherwise, she underwent an uneventful postoperative course.  Her final  pathology revealed a stage IA adenocarcinoma.  Her chest tubes were removed  in a stepwise fashion, once all air leak had resolved, and her followup  chest x-ray showed no definitive residual pneumothorax following chest tube  removal.  By postop day #6, her surgical incision sites were all healing  well.  She was ambulating in the halls without difficulty.  She was  tolerating a regular diet and was having normal bowel and bladder function.  Her labs showed a hemoglobin of 11.3, hematocrit 33, white count 9.0,  platelet 368,000, sodium 137, potassium 4.2, BUN 13, creatinine 0.9.  She  was evaluated by Dr.  Edwyna Shell and it was felt that she could be discharged  home at that time.   DISCHARGE MEDICATIONS:  1.  Tylox one to two q.4 h. p.r.n. for pain.  2.  Potassium 20 mEq daily.  3.  Gabapentin 600 mg twice daily.  4.  Felodipine 5 mg daily.  5.  Lipitor 20 mg daily.  6.  Aspirin 81 mg daily.  7.  Diovan/HCT 160/12.5 mg twice daily.   DISCHARGE INSTRUCTIONS:  She is asked to refrain from driving, heavy lifting  or strenuous activity.  She may continue to ambulate daily and use her  incentive spirometer.  She may shower daily and clean her incisions with  soap and water.  She will continue her same preoperative diet.   DISCHARGE FOLLOWUP:  She will see Dr. Edwyna Shell back in the office in 1 week  with a chest x-ray at [facility] and our office will contact  her with an appointment.  She is  also asked to make a followup appointment  Dr. Arbutus Ped.  She will call in the interim if she experiences any problems  or has questions.      Coral Ceo, P.A.    ______________________________  Ines Bloomer, M.D.    GC/MEDQ  D:  09/27/2005  T:  09/29/2005  Job:  [id number]   cc:   Lajuana Matte, MD  Fax: [phone]

## 2010-12-01 NOTE — Op Note (Signed)
[facility]  Patient:    Marissa Ho, Marissa Ho Visit Number: [id number] MRN: [id number]          Service Type: NES Location: NESC Attending Physician:  Trisha Mangle Dictated by:   Veverly Fells Vernie Ammons, M.D. Proc. Date: 10/22/01 Admit Date:  10/22/2001                             Operative Report  PREOPERATIVE DIAGNOSIS:  Left renal mass.  POSTOPERATIVE DIAGNOSIS:  Left renal mass.  PROCEDURE:  Cystoscopy, left retrograde pyelogram with interpretation, left ureteroscopy with barbotage urine for cytology, and cold cup biopsy of renal mass and double-J stent placement.  SURGEON:  Mark C. Vernie Ammons, M.D.  ANESTHESIA:  General.  DRAINS:  6 French 24 cm double-J stent in the left ureter.  SPECIMENS:  Barbotage urine from left renal pelvis and cold cup biopsies of left renal mass.  ESTIMATED BLOOD LOSS:  Less than 1 cc.  COMPLICATIONS:  None.  INDICATIONS:  The patient is a 77 year old white female, who presented to my office with gross hematuria and was found to have blood emanating from the left ureteral orifice.  A subsequent IVP revealed a mass in the upper pole of the left kidney with distortion of the collecting system and subsequent CT scanning reveals the mass to be an enhancing solid lesion in the area of the collecting system with no evidence of extension outside of the kidney.  There is some small adenopathy, but it appears less then 1 cm in size, and her chest x-ray showed no evidence of metastatic disease.  The remainder of the CT scan revealed no evidence of abdominal metastatic disease.  I discussed with the patient the planned procedure and the rationale behind determining whether this was renal cell carcinoma or transitional cell carcinoma from both a surgical and pathologic standpoint.  We went over the risks and complications of this procedure as well as the need for a stent postoperatively.  She understands and has elected to  proceed with evaluation today.  DESCRIPTION OF OPERATION:  After informed consent, the patient brought to the major OR, placed on the table, administered general endotracheal anesthesia, then moved to the dorsal lithotomy position.  Her genitalia was sterilely prepped and draped, and a 6 French rigid long ureteroscope was then introduced into the bladder.  The left ureteral orifice was identified, and the ureteroscope was advanced up the ureter under direct visualization.  Once I got the scope into the area of the renal pelvis, I then injected contrast and noted a massive filling defect in the upper pole collecting system, most consistent with transitional cell carcinoma from a fluoroscopic standpoint.  Next, I obtained a barbotage urine specimen from the left renal pelvis and sent this to pathology.  Finally, the 3 French cold cup biopsy forceps were passed through the ureteroscope, and multiple biopsies of a ton of tumor that was seen protruding out of the upper pole infundibulum which was biopsied multiple times.  These were sent separately.  I then removed the ureteroscope and inserted a cystoscope, passing a 0.038 inch floppy tip guidewire up the left ureter under fluoroscopic control.  I then passed the double-J stent with good curl being noted in the renal pelvis and bladder after removing the guidewire.  The bladder was drained.  The patient received a No. 16A B&O suppository and was awakened and taken to the recovery room in stable and satisfactory condition.  She tolerated the procedure well.  There were no intraoperative complications.  Prior to the operation, I discussed with the patient and her family in the preop waiting area the fact that she has a solid enhancing mass in her left kidney.  This is most likely either transitional cell carcinoma or possibly renal cell carcinoma.  We discussed the fact that either way, she does not appear to have any metastatic spread;  therefore, the treatment of choice is a radical nephrectomy.  I went over the incision for a radical nephrectomy as well as the risks and complications including but not limited to bleeding, infection, anesthetic risks, MI, PE, DVT, other pulmonary and cardiac complications, as well as injury to surrounding organs such as the intestines, pancreas, spleen, stomach, etc.  We discussed possibility of a pleural breach necessitating possible chest tube.  We also discussed the fact that if this is a renal cell carcinoma, then I would perform a radical nephrectomy alone.  If, on the other hand, based on my current findings, this is a transitional cell carcinoma, we discussed left nephroureterectomy and the fact that a second Gibson-type incision would be made, and the additional risk of that in addition to the possible injury to the blood vessels would be possible, leak from the bladder, possible need for a drain and the definite need for a Foley catheter.  We went over the possibility of incomplete removal of all tumor stones despite my best efforts as well as possible need for adjunctive therapy depending on the pathologic stage of the tumor.  I went over the anticipated hospital course and will have her stay off of her aspirin for now.  Finally, I have discussed with her and family that this procedure today may very well make the diagnosis; however, if there is an inconclusive diagnosis as to the exact tumor pathology and cell type, then I would therefore proceed with a left nephrectomy with frozen section evaluation intraoperatively and base the need for ureterectomy on whether the tumor was transitional cell carcinoma. Dictated by:   Veverly Fells Vernie Ammons, M.D. Attending Physician:  Trisha Mangle DD:  10/22/01 TD:  10/23/01 Job: [id number]

## 2010-12-01 NOTE — H&P (Signed)
[facility]  Patient:    Marissa, Ho Visit Number: [id number] MRN: [id number]          Service Type: SUR Location: 3W 0378 01 Attending Physician:  Trisha Mangle Dictated by:   Veverly Fells Vernie Ammons, M.D. Admit Date:  11/14/2001   CC:         Thora Lance, M.D.   History and Physical  HISTORY OF PRESENT ILLNESS:  This patient is a 77 year old white female who presented to me on October 17, 2001, with a history of gross hematuria that had begun about five days prior.  It was gross, total.  It was associated with no flank pain or dysuria and no change in her voiding pattern.  She had never had prior or gross hematuria and no stress incontinence or other major urologic complaints.  While in the office I performed cystoscopy and noted blood effluxing from the left ureteral orifice and then evaluated the left side with an IVP which showed a mass in the upper pole of the left kidney.  A CT scan was then obtained and revealed an enhancing mass centered in the collecting system of the upper pole of the left kidney measuring 4 cm with some small periaortic lymph nodes, none of which were greater than 1 cm in size.  No other evidence of metastatic spread was noted within the abdomen or pelvis. She then underwent cystoscopy and ureteroscopy in the operating room to help determine the type of lesion.  The bladder was noted to be unremarkable again, but retrograde pyelogram was most consistent with transitional cell carcinoma by its pattern.  Biopsies of the lesion revealed chronic inflammation and benign transitional mucosa.  However, washings of the renal pelvis revealed atypical urothelial cells suspicious for carcinoma.  I, therefore, discussed with this patient the fact that she appears to have an obvious malignancy, although I cannot tell her 100% that it is a malignancy.  It certainly needs treatment, and we discussed the treatment options.  She is  admitted now for surgical therapy of this left renal lesion.  PAST MEDICAL HISTORY:  She has recently been found to have a random blood sugar of over 200, with a fasting blood sugar rechecked being normal at 96 and a hemoglobin A1C slightly elevated at 6.9, indicating likely borderline diabetes.  She also has some EKG abnormalities consistent with some lateral ST and T-wave changes, but they have been present since at least 2001, and she has no chest pain at this time.  Otherwise, she has some hypertension, hyperlipidemia, degenerative disk disease, and history of tobacco use.  PAST SURGICAL HISTORY: 1. Total abdominal hysterectomy and BSO in the 1970s, for fibroids and    bleeding. 2. Left breast lump removed in 1968. 3. Back surgery at L4-L5 level in 1982. 4. Left eye laser surgery in 1985. 5. Fracture of left foot in 1999.  MEDICATIONS: 1. Lipitor 20 mg q.d. 2. Atacand/HCTZ 16/25 mg q.d. 3. Calcium. 4. Vitamin D. 5. Aspirin 81 mg.  ALLERGIES:  No known drug allergies.  SOCIAL HISTORY:  She reports smoking a pack of cigarettes a day for 50 years and continues to do so.  Denies alcohol use.  FAMILY HISTORY:  Positive for hypertension.  Father died at 74 of heart disease.  Mother died at 39 of hypertension and had diabetes.  No GU malignancy or renal disease.  REVIEW OF SYSTEMS:  As noted above and per health history section 2 in the chart, which was  reviewed and signed.  PHYSICAL EXAMINATION:  VITAL SIGNS:  Temperature 97.3, pulse 72 and regular, respirations 16, blood pressure 125/50.  GENERAL:  Elderly white female in no apparent distress.  HEENT:  Atraumatic, normocephalic.  Oropharynx is clear.  NECK:  Supple.  CHEST:  Clear to auscultation.  CARDIOVASCULAR:  Regular rate and rhythm.  ABDOMEN:  Soft and nontender.  Without mass or CVAT.  No hepatosplenomegaly or suprapubic mass.  NECK:  No thyromegaly or neck bruits.  CARDIOVASCULAR:  Regular rate and  rhythm, without murmur.  BREASTS:  Not examined.  GENITOURINARY:  Normal external female genitalia.  ______ urethral meatus. No periurethral lesions or discharge.  No masses at the vaginal cuff were palpable.  The bladder base is palpably normal.  No lesions.  RECTAL:  Normal anus and perineum.  EXTREMITIES:  Without clubbing, cyanosis, or edema.  NEUROLOGIC:  Without gross focal neurologic deficit.  Alert and oriented, with appropriate mood and affect.  LABORATORY DATA:  Electrolytes normal with a sodium of 141 and potassium of 4.0, creatinine is 1.0.  Liver function tests are normal.  CBC:  White count 10.1, H&H 14.1 and 40.7, platelets 345,000.  INR 1.0, PT 13.3, PTT 34.  Blood gas on room air revealed pH of 7.43, pCO2 39, pO2 74.  Chest x-ray reveals changes of COPD.  Normal heart size.  Atherosclerotic changes of the aorta.  Otherwise, no acute process.  EKG reveals some ST and T-wave abnormality laterally, unchanged from previous study in 2001.  IMPRESSION: 1. This patient has a large upper pole collecting system mass that is most    consistent with transitional cell carcinoma.  That diagnosis has not been    absolutely determined yet; however, it certainly appears malignant,    although there is a small outside chance that this could be a benign    lesion.  I have discussed with the patient the need for surgical    exploration and radical nephrectomy.  At that time the lesion will be    evaluated by frozen section and, if it is transitional cell carcinoma, she    will undergo ureterectomy at that time.  She understands this, as well as    all of the risks and complications which have been outlined in my office    notes and placed on the chart. 2. New diagnosis of borderline diabetes.  That will be monitored while she is    in the hospital. 3. Hypertension and some form of possible cardiac disease, which will also be    closely monitored.  PLAN:  1. Routine DVT and  PE prophylaxis. 2. Left radical nephrectomy with frozen section and likely distal left    ureterectomy. 3. Perioperative antibiotics. Dictated by:   Veverly Fells Vernie Ammons, M.D. Attending Physician:  Trisha Mangle DD:  11/14/01 TD:  11/15/01 Job: [id number]

## 2010-12-01 NOTE — H&P (Signed)
NAME:  Marissa Ho, Marissa Ho              ACCOUNT NO.:  [id number]   MEDICAL RECORD NO.:  [id number]          PATIENT TYPE:  INP   LOCATION:  NA                           FACILITY:  [facility]   PHYSICIAN:  Ines Bloomer, M.D. DATE OF BIRTH:  1932/05/30   DATE OF ADMISSION:  08/03/2005  DATE OF DISCHARGE:                                HISTORY & PHYSICAL   CHIEF COMPLAINT:  Lung mass.   HISTORY OF PRESENT ILLNESS:  This 77 year old patient was having a left  nephrectomy for transitional cell cancer.  Was found to have a 1.8 x 1  nodule with some mild mediastinal adenopathy on a follow-up CT scan.  PET  scan was done which showed increased uptake with no other uptake.  The  lesion is in the anterior segment of the left upper lobe.  Pulmonary  function tests show an FVC of 2.13 and an FEV1 of 1.87.  Her diffusion  capacity is 83%.  She has had no hemoptysis but does have excess sputum.  No  fevers or chills.  She has smoked at least a pack a day for many years and  continues to smoke.   PAST MEDICAL HISTORY:  Significant for hypertension, hypercholesterolemia.   ALLERGIES:  SHE HAS NO ALLERGIES.   MEDICATIONS:  Include:  1.  Lipitor 20 mg a day.  2.  Potassium chloride 20 mEq a day.  3.  __________ 600 mg a day.  4.  Diovan/hydrochlorothiazide 160/12.5 two a day.  5.  Aspirin 81 mg a day.  6.  Flonase as needed.  7.  Folic acid.   FAMILY HISTORY:  Positive for vascular disease but negative for cancer.   SOCIAL HISTORY:  She is widowed and has three children.  As mentioned she  smokes up to a pack a day.  Does not drink alcohol on a regular basis.   REVIEW OF SYSTEMS:  She is 150 pounds.  She is 5 feet 2-1/2.  Her weight has  been stable.  CARDIAC:  No angina or atrial fibrillation.  PULMONARY:  She  gets shortness of breath with exertion.  Has had bronchitis and pneumonia in  the past as well as has been treated for asthma.  GASTROINTESTINAL:  No  nausea, vomiting, constipation, no  dysphagia.  GENITOURINARY:  No dysuria.  See history of present illness for nephrectomy.  VASCULAR:  She gets some  pain in her legs with walking.  No TIAs or DVT.  NEUROLOGIC:  No headaches,  blackouts or seizures.  ORTHOPEDIC:  She has some chronic large joint pains  and some pains in her hand joints.  No muscular pains.  PSYCHIATRIC:  No  history of depression or other psychiatric illnesses.  EYES/ENT:  No recent  change in her eyesight or hearing.  HEMATOLOGICAL:  No problems with anemia.   PHYSICAL EXAMINATION:  She is a well-developed elderly Caucasian female in  no acute distress.  Her blood pressure is 132/70, pulse 70, respirations 18,  saturations were 97%.  HEAD:  Atraumatic.  EYES:  Pupils equal, round and reactive to light and accommodation.  Extraocular  movements are normal.  EARS:  Tympanic membranes are intact.  NOSE:  There is no septal deviation.  THROAT:  Without lesion.  NECK:  Supple.  There is no supraclavicular or axillary adenopathy.  No  thyromegaly, no carotid bruits.  CHEST:  There is some bilateral wheezes.  HEART:  Regular sinus rhythm.  No murmurs.  ABDOMEN:  Soft.  There is no hepatosplenomegaly.  Bowel sounds are normal.  EXTREMITIES:  Pulses are 2+.  There is no clubbing or edema.  NEUROLOGICAL:  She is oriented times three.  Sensory and motor are intact.  Cranial nerves are intact.   IMPRESSION:  1.  Right upper lobe nodule, rule out metastatic disease, rule out primary      lung cancer.  2.  Status post left nephrectomy for transitional cell cancer.  3.  Chronic obstructive pulmonary disease.  4.  Hypertension.  5.  Hypercholesterolemia.   PLAN:  Right VATS, either right upper lobectomy or wedge resection of right  upper lobe.           ______________________________  Ines Bloomer, M.D.     DPB/MEDQ  D:  08/01/2005  T:  08/01/2005  Job:  [id number]

## 2010-12-01 NOTE — Op Note (Signed)
NAME:  Marissa Ho, Marissa Ho                        ACCOUNT NO.:  [id number]   MEDICAL RECORD NO.:  [id number]                   PATIENT TYPE:  INP   LOCATION:  0359                                 FACILITY:  [facility]   PHYSICIAN:  Hinton Dyer, D.D.S.            DATE OF BIRTH:  01-Sep-1931   DATE OF PROCEDURE:  04/28/2002  DATE OF DISCHARGE:                                 OPERATIVE REPORT   PREOPERATIVE DIAGNOSES:  Severe facial cellulitis with respiratory airway  embarrassment.   POSTOPERATIVE DIAGNOSES:  Severe facial cellulitis with respiratory airway  embarrassment.   PROCEDURE:  Extraoral incision and drainage, culture and sensitivity.   ANESTHESIA:  General.   SURGEON:  Hinton Dyer, D.D.S.   ESTIMATED BLOOD LOSS:  50 cc.   CONDITION:  Fair.   BRIEF HISTORY:  The patient was treated by her general dentist for several  fillings within the last week or so and had injections in the area. Several  days later slowly developing a facial swelling was placed on antibiotics and  continued to get worse. The patient presented to the emergency room with  some difficulty breathing, large facial cellulitis extending from the malar  arch on the right side to the neck area and across the midline to the left  mandible. The swelling in the right parotid area was brawny with some  potential fluctuance felt. She had difficulty speaking with some pushing of  the tongue forward and the soft palate appeared to also be boggy and  swollen. CT showed large multiloculated areas of pus on the medial and  lateral aspect of the mandible on the right side.   The patient was then brought to the operating room, placed in the supine  position which she remained throughout the whole procedure. She was  intubated via a right fiberoptic intubation and once intubated the area was  prepped extraorally with Betadine scrub and paint. The area was draped off  in the usual fashion for extraoral procedure. A  15 gauge needle on a 5 cc  syringe was then inserted through the skin to the mandible and probed around  evacuating approximately 4 cc of pus greenish in color. A second syringe was  inserted through and another 3-4 cc of pus was removed. A curved clamp was  then inserted into the hole where the needle was after a small skin incision  was made with a 15 blade parallel to normal facial crease. Blunt dissection  was used to carry down to the medial aspect of the mandible and a large  amount of greenish pus was then evacuated from the area with suction and  digital pressure. Once all the pus that was in the area was evacuated, the  area was irrigated out and a Penrose drain was inserted into the defect and  sutured in place with one 0-Vicryl suture. The area was then dressed and  then attention was then  turned intraorally. An eye block was placed on the  patient's left side and the throat was examined. The soft palate appeared to  be full and boggy and a needle was inserted in several places but no pus was  obtained. A thick swelling on the medial aspect of the ascending ramus was  aspirated but no pus was retrieved from the area. The mouth was irrigated  out copiously and debris and thick mucus was removed from around the teeth  and tongue area. The mouth was then irrigated out again and the patient was  then dressed promptly for the dressing over the Penrose drain and she was  returned to the recovery room intubated. As soon as she is fully awake, the  endotracheal tube will be removed watching the airway. An infectious disease  consult will be obtained later and the patient will be followed closely by  me until resolution the infection.                                               Hinton Dyer, D.D.S.    [name]  D:  04/28/2002  T:  04/28/2002  Job:  [id number]

## 2010-12-01 NOTE — H&P (Signed)
NAME:  Marissa Ho, Marissa Ho                        ACCOUNT NO.:  [id number]   MEDICAL RECORD NO.:  [id number]                   PATIENT TYPE:  INP   LOCATION:  0102                                 FACILITY:  [facility]   PHYSICIAN:  Hinton Dyer, D.D.S.            DATE OF BIRTH:  [birth year]   DATE OF ADMISSION:  04/28/2002  DATE OF DISCHARGE:                                HISTORY & PHYSICAL   PREOPERATIVE DIAGNOSES:  Massive facial cellulitis with respiratory  embarrassment.   POSTOPERATIVE DIAGNOSES:  Massive facial cellulitis with respiratory  embarrassment.   CHIEF COMPLAINT:  Difficulty breathing, large facial swelling, severe  difficulty swallowing, severe agitation.   BRIEF HISTORY:  The patient was seen by a general dentist approximately a  week before.  Had several fillings done and had an injection to the area  multiple times.  She developed a facial swelling several days later which  did not improve following administration of antibiotics.  As she worsened  she presented to the emergency room early this morning for evaluation and  treatment.   PAST MEDICAL HISTORY:  1. Renal cell carcinoma with a nephrectomy.  2. Hypercholesterolemia.  3. Positive smoking one pack per day.  4. No other medical problems were noted.   When she presented to the emergency room a CT scan was done which  demonstrated the extent of the facial cellulitis as well as the extent of  the pus around the mandible in the medial and lateral side.  The airway was  also examined and was found to be pushed to the left side.   ALLERGIES:  There were no known allergies.   MEDICATIONS:  Lipitor, amoxicillin, Medrol dose pack.   CLINICAL EXAMINATION:  HEENT:  Head was normocephalic and normal hair  distribution.  Pupils were equally round and reactive to light and  accommodation.  Extraocular muscles were within normal limits.  No diplopia  was noted.  The was a large brawny facial cellulitis on the  right side with  potential fluctuance in the parotid region extending up to ear and almost  down to the clavicle.  There is swelling across the midline to the left side  of the face.  There was severe trismus with difficulty in examining the  mouth intraorally.  NECK:  Soft with the trachea in the midline and no deviation noted around C4  and C5 area.  CHEST:  Bilaterally expansive.  Some wheezing was noted, but cleared with  coughing at the base of both lungs but no rales or rhonchi were noted.  HEART:  Regular normal S1, S2.  PMI was in the fifth intercostal space.  ABDOMEN:  Soft without guarding, organomegaly.  Normal bowel sounds were  heard.  EXTREMITIES:  Within normal limits.  GENITALIA:  Deferred.   IMPRESSION:  Facial cellulitis with respiratory embarrassment, severe  dysphagia, and moderate agitation.   TREATMENT PLAN:  Incision and drainage with  cultures as well as culture and  sensitivities.  An ID consult will also be gotten.                                               Hinton Dyer, D.D.S.    [name]  D:  04/28/2002  T:  04/28/2002  Job:  [id number]

## 2010-12-01 NOTE — Discharge Summary (Signed)
[place]  Patient:    Marissa Ho, Marissa Ho                     MRN: [id number] Adm. Date:  [id number] Disc. Date: [id number] Attending:  Lillia Mountain                           Discharge Summary  REASON FOR ADMISSION:  A 77 year old white female with history of hypertension, hyperlipidemia and tobacco use who developed mild pain and pressure in her central chest associated with some left shoulder and arm pain last night.  This morning at work, the pain became more severe and she felt short of breath.  She was given some oxygen at work and her pain felt better and has not resumed today.  SIGNIFICANT FINDINGS:  Blood pressure 122/64, heart rate 80, temperature 98.6. The rest of her exam was completely normal.  LABORATORIES AT ADMISSION:  CBC revealed wbc 12.8, hemoglobin 13.1, platelet count 371.  PT 13.5, PTT 35. Sodium 149, potassium 4.0, chloride 106, bicarbonate 32, BUN 11, creatinine 0.6, glucose 121, calcium 9.2, total protein 6.3, albumin 3.3, AST 14, ALP 20, alkaline phosphatase 67, total bilirubin 0.6.  Creatinine kinase 71, creatinine kinase MB 1.9, troponin I less than 0.01.  EKG showed normal sinus rhythm and lateral ST wave inversions which are old.  HOSPITAL COURSE:  The patient was admitted for chest pain, rule out unstable angina.  She was placed on Lovenox and aspirin.  Cardiac enzymes were negative the next morning. She was seen by Armanda Magic, M.D., of the cardiology service.  An adenosine Cardiolite was done on November 08, 2000, and showed no evidence of ischemia.  The patient was discharged in good condition.  DISCHARGE DIAGNOSES: 1. Chest pain. 2. Hypertension. 3. Hyperlipidemia.  DISCHARGE MEDICATIONS: 1. ____________ 16/12.5 mg once a day. 2. Zocor 40 mg q.p.m. 3. Caltrate with vitamin D 600 mg b.i.d.  ACTIVITY:  No restrictions.  DIET:  Low sodium.  FOLLOW-UP:  In four weeks with Thora Lance, M.D. DD:   12/05/00 TD:  12/06/00 Job: [id number]

## 2010-12-05 ENCOUNTER — Ambulatory Visit (INDEPENDENT_AMBULATORY_CARE_PROVIDER_SITE_OTHER): Payer: Medicare Other | Admitting: Family Medicine

## 2010-12-05 ENCOUNTER — Encounter: Payer: Self-pay | Admitting: Family Medicine

## 2010-12-05 VITALS — BP 140/70 | HR 76 | Temp 97.7°F | Wt 145.0 lb

## 2010-12-05 DIAGNOSIS — G609 Hereditary and idiopathic neuropathy, unspecified: Secondary | ICD-10-CM

## 2010-12-05 DIAGNOSIS — I10 Essential (primary) hypertension: Secondary | ICD-10-CM

## 2010-12-05 DIAGNOSIS — Z720 Tobacco use: Secondary | ICD-10-CM

## 2010-12-05 DIAGNOSIS — F17200 Nicotine dependence, unspecified, uncomplicated: Secondary | ICD-10-CM

## 2010-12-05 DIAGNOSIS — G589 Mononeuropathy, unspecified: Secondary | ICD-10-CM

## 2010-12-05 DIAGNOSIS — G629 Polyneuropathy, unspecified: Secondary | ICD-10-CM

## 2010-12-05 DIAGNOSIS — E1149 Type 2 diabetes mellitus with other diabetic neurological complication: Secondary | ICD-10-CM

## 2010-12-05 DIAGNOSIS — E785 Hyperlipidemia, unspecified: Secondary | ICD-10-CM

## 2010-12-05 LAB — BASIC METABOLIC PANEL
BUN: 13 mg/dL (ref 6–23)
CO2: 28 mEq/L (ref 19–32)
Chloride: 98 mEq/L (ref 96–112)
Creatinine, Ser: 0.8 mg/dL (ref 0.4–1.2)
Glucose, Bld: 137 mg/dL — ABNORMAL HIGH (ref 70–99)

## 2010-12-05 MED ORDER — HYDROCODONE-ACETAMINOPHEN 5-500 MG PO TABS: 1.0000 | ORAL_TABLET | Freq: Every evening | ORAL | Status: DC | PRN

## 2010-12-05 NOTE — Assessment & Plan Note (Signed)
Continue to encourage cessation.  Pt to look into [employer].

## 2010-12-05 NOTE — Assessment & Plan Note (Signed)
Not at goal <130/80.   May increase next visit if SBP remaining > 130. ROS negative.

## 2010-12-05 NOTE — Assessment & Plan Note (Signed)
Last LDL <100.  Continue lipitor.  No myalgias.

## 2010-12-05 NOTE — Assessment & Plan Note (Addendum)
Per patient previously attributed to DM peripheral neuropathy but not consistent with DM (no paresthesias, numbness).   No EtOH per pt.   Seems familial. ? erytheromelalgia vs burning feet syndrome (both hereditary). Refilled vicodin, continue gabapentin. Already on ASA.   Check TSH, B12.

## 2010-12-05 NOTE — Patient Instructions (Signed)
Blood work today to check on diabetes.Peri Jefferson to see you, continue to work on quitting smoking. Return for physical in summer months.

## 2010-12-05 NOTE — Assessment & Plan Note (Signed)
Good control from last visit, due for recheck of blood work.  On ARB, A1c last <7%.  Foot exam today. Return in 2 mo for CPE then afterwards for DM recheck.

## 2010-12-05 NOTE — Progress Notes (Signed)
  Subjective:    Patient ID: Marissa Ho, female    DOB: 08-25-1931, 77 y.o.   MRN: [id number]  HPI CC: 2 mo f/u  Had 3 tick bites on vacation in [place].  Went to [facility] and placed on abx (doxy x 10 days).  Cough - previously seen for irritant cough after inhaling bleach.  States breathing and cough actually better.  Smoking - down to 4-5 cigarettes/day.  Asks about electronic cigarette.  Motivated to quit.  DM - sugars running 110-150 fasting.  Brings log.  No leg numbness.  On gabapentin.  Feet burn and hurt, stays at feet.  Told due to sugar in past but not typical of diabetic neuropathy.  States daughter and son have similar issue with feet.  Also mother with similar issues.  HTN - today elevated BP.  States runs good at home.  Compliant with meds.  No HA, vision changes, CP/tightness, SOB, leg swelling. BP Readings from Last 3 Encounters:  12/05/10 140/70  10/24/10 140/70  10/03/10 130/68   HLD - on atorvastatin, controlling LDL well.  Medications and allergies reviewed and updated in chart. PMHx reviewed and updated in chart  Review of Systems Per HPI    Objective:   Physical Exam  [nursing notereviewed. Constitutional: She appears well-developed and well-nourished. No distress.  HENT:  Head: Normocephalic and atraumatic.  Mouth/Throat: Oropharynx is clear and moist. No oropharyngeal exudate.  Eyes: Conjunctivae and EOM are normal. Pupils are equal, round, and reactive to light. No scleral icterus.  Neck: Normal range of motion. Neck supple. Carotid bruit is not present.  Cardiovascular: Normal rate, regular rhythm, normal heart sounds and intact distal pulses.   No murmur heard. Pulses:      Radial pulses are 2+ on the right side, and 2+ on the left side.       Dorsalis pedis pulses are 2+ on the right side, and 2+ on the left side.       Posterior tibial pulses are 2+ on the right side, and 2+ on the left side.  Pulmonary/Chest: Effort normal and breath sounds  normal. No respiratory distress. She has no wheezes. She has no rales.  Abdominal: Soft. There is no tenderness.       No abd/renal bruit  Lymphadenopathy:    She has no cervical adenopathy.  Neurological: No sensory deficit.       Monofilament intact bilateral feet/soles  Skin: Skin is warm and dry. No rash noted.       Bilateral feet markedly erythematous  Psychiatric: She has a normal mood and affect.   Diabetic foot exam: Normal inspection No skin breakdown No calluses  Normal DP/PT pulses Normal sensation to light tough and monofilament Nails normal     Assessment & Plan:

## 2010-12-25 ENCOUNTER — Other Ambulatory Visit: Payer: Self-pay | Admitting: *Deleted

## 2010-12-25 ENCOUNTER — Other Ambulatory Visit: Payer: Self-pay | Admitting: Family Medicine

## 2010-12-25 MED ORDER — HYDROCODONE-ACETAMINOPHEN 5-500 MG PO TABS: 1.0000 | ORAL_TABLET | Freq: Every evening | ORAL | Status: DC | PRN

## 2010-12-25 NOTE — Telephone Encounter (Signed)
Rx called in as directed.   

## 2010-12-25 NOTE — Telephone Encounter (Signed)
Faxed request from pharmacy. Okay to refill?

## 2010-12-25 NOTE — Telephone Encounter (Signed)
Sent in

## 2010-12-25 NOTE — Telephone Encounter (Signed)
Ok to refill.  Please call in.

## 2011-01-18 ENCOUNTER — Other Ambulatory Visit: Payer: Self-pay | Admitting: *Deleted

## 2011-01-18 MED ORDER — HYDROCODONE-ACETAMINOPHEN 5-500 MG PO TABS: 1.0000 | ORAL_TABLET | Freq: Two times a day (BID) | ORAL | Status: DC | PRN

## 2011-01-18 MED ORDER — HYDROCODONE-ACETAMINOPHEN 5-500 MG PO TABS: 1.0000 | ORAL_TABLET | Freq: Every evening | ORAL | Status: DC | PRN

## 2011-01-18 NOTE — Telephone Encounter (Signed)
Rx called in as directed.   

## 2011-01-18 NOTE — Telephone Encounter (Signed)
Pt is asking for a new script for vicodin to be called to [facility].  She said she cant get the one sent in today filled because the pharmacist told her it is too early.  She says she sometimes has to take 2 a day and wants the script changed to reflect that.  She is out of medicine now so is asking that this be taken care of soon.

## 2011-01-18 NOTE — Telephone Encounter (Signed)
Done. Notify pt. plz phone in and advise ok to fill early.

## 2011-01-18 NOTE — Telephone Encounter (Signed)
Please phone in

## 2011-01-19 NOTE — Telephone Encounter (Signed)
Rx called in with changes and okay'd filling early.

## 2011-01-28 ENCOUNTER — Other Ambulatory Visit: Payer: Self-pay | Admitting: Family Medicine

## 2011-01-28 DIAGNOSIS — I10 Essential (primary) hypertension: Secondary | ICD-10-CM

## 2011-01-28 DIAGNOSIS — E1149 Type 2 diabetes mellitus with other diabetic neurological complication: Secondary | ICD-10-CM

## 2011-01-28 DIAGNOSIS — E785 Hyperlipidemia, unspecified: Secondary | ICD-10-CM

## 2011-01-30 ENCOUNTER — Other Ambulatory Visit (INDEPENDENT_AMBULATORY_CARE_PROVIDER_SITE_OTHER): Payer: Medicare Other | Admitting: Family Medicine

## 2011-01-30 DIAGNOSIS — E785 Hyperlipidemia, unspecified: Secondary | ICD-10-CM

## 2011-01-30 DIAGNOSIS — E1149 Type 2 diabetes mellitus with other diabetic neurological complication: Secondary | ICD-10-CM

## 2011-01-30 DIAGNOSIS — I10 Essential (primary) hypertension: Secondary | ICD-10-CM

## 2011-01-30 LAB — LDL CHOLESTEROL, DIRECT: Direct LDL: 93.5 mg/dL

## 2011-01-30 LAB — LIPID PANEL
HDL: 36.6 mg/dL — ABNORMAL LOW (ref >39.00)
Total CHOL/HDL Ratio: 4
Triglycerides: 247 mg/dL — ABNORMAL HIGH (ref 0.0–149.0)

## 2011-01-30 LAB — CBC WITH DIFFERENTIAL/PLATELET
Eosinophils Absolute: 0.1 10*3/uL (ref 0.0–0.7)
MCHC: 33.8 g/dL (ref 30.0–36.0)
MCV: 95.6 fl (ref 78.0–100.0)
Monocytes Absolute: 1 10*3/uL (ref 0.1–1.0)
Neutrophils Relative %: 72.8 % (ref 43.0–77.0)
Platelets: 309 10*3/uL (ref 150.0–400.0)
RDW: 14 % (ref 11.5–14.6)

## 2011-01-30 LAB — COMPREHENSIVE METABOLIC PANEL
AST: 23 U/L (ref 0–37)
Albumin: 4.6 g/dL (ref 3.5–5.2)
Alkaline Phosphatase: 74 U/L (ref 39–117)
Potassium: 4 mEq/L (ref 3.5–5.1)
Sodium: 137 mEq/L (ref 135–145)
Total Protein: 7.8 g/dL (ref 6.0–8.3)

## 2011-02-06 ENCOUNTER — Ambulatory Visit (INDEPENDENT_AMBULATORY_CARE_PROVIDER_SITE_OTHER): Payer: Medicare Other | Admitting: Family Medicine

## 2011-02-06 ENCOUNTER — Encounter: Payer: Self-pay | Admitting: Family Medicine

## 2011-02-06 VITALS — BP 136/80 | HR 76 | Temp 98.1°F | Wt 148.2 lb

## 2011-02-06 DIAGNOSIS — G589 Mononeuropathy, unspecified: Secondary | ICD-10-CM

## 2011-02-06 DIAGNOSIS — Z0000 Encounter for general adult medical examination without abnormal findings: Secondary | ICD-10-CM

## 2011-02-06 DIAGNOSIS — E1149 Type 2 diabetes mellitus with other diabetic neurological complication: Secondary | ICD-10-CM

## 2011-02-06 DIAGNOSIS — Z0110 Encounter for examination of ears and hearing without abnormal findings: Secondary | ICD-10-CM

## 2011-02-06 DIAGNOSIS — Z0100 Encounter for examination of eyes and vision without abnormal findings: Secondary | ICD-10-CM

## 2011-02-06 DIAGNOSIS — E785 Hyperlipidemia, unspecified: Secondary | ICD-10-CM

## 2011-02-06 DIAGNOSIS — I10 Essential (primary) hypertension: Secondary | ICD-10-CM

## 2011-02-06 DIAGNOSIS — G629 Polyneuropathy, unspecified: Secondary | ICD-10-CM

## 2011-02-06 DIAGNOSIS — Z720 Tobacco use: Secondary | ICD-10-CM

## 2011-02-06 MED ORDER — HYDROCODONE-ACETAMINOPHEN 5-500 MG PO TABS: 1.0000 | ORAL_TABLET | Freq: Two times a day (BID) | ORAL | Status: DC | PRN

## 2011-02-06 MED ORDER — FREESTYLE LANCETS MISC: Status: AC

## 2011-02-06 NOTE — Assessment & Plan Note (Signed)
At goal on current regimen, continue. Discussed lowering triglycerides with diet changes.

## 2011-02-06 NOTE — Assessment & Plan Note (Signed)
Per patient previously attributed to DM peripheral neuropathy but not consistent with DM (no paresthesias, numbness).   No EtOH.  TSH, B12 normal. Seems familial. ? erytheromelalgia vs burning feet syndrome (both hereditary). Refilled vicodin with sig BID PRN, #60 each month, continue gabapentin which is helping some. Already on ASA.   Lab Results  Component Value Date   WBC 11.4* 01/30/2011   HGB 14.2 01/30/2011   HCT 41.9 01/30/2011   MCV 95.6 01/30/2011   PLT 309.0 01/30/2011

## 2011-02-06 NOTE — Progress Notes (Addendum)
Subjective:    Patient ID: Marissa Ho, female    DOB: 1932/07/01, 76 y.o.   MRN: [id number]  HPI CC: CPE  Brings log of bp and sugar.  Sugars ranging fasting 100-130.  Blood pressure ranging from 120-150/70-80s.  No HA, vision changes, CP/tightness, SOB, leg swelling.  No low sugars.  No dizziness, fainting.  last vision check was 1+ yrs ago.  Due for one.  Sister died 29-Dec-2022 from blood clot, had CVA and was in [place] then at [facility].  Deteriorated recently.  Pt worried about deterirating as well.  Foot/leg pain - Legs swelling, tender.  Has tried to elevate legs.  Takes viocin twice daily, doesn't control pain.  Has also tried soaking in epsom salts as well as using ice, doesn't really help.  Pain worse in evenings.  Burning pain, also cold feet.  Swelling worse in as day progresses.  Has seen neurologist in past.  Has been told due to periph neuropathy in past, however no burning or numbness of feet.  Smoking - 1 ppd.  Thinking about cutting back.  Uses walker at night, cane during day.  When uses assistive devices doesn't fall.  O/w has had a few falls recently, no injury.  Lives with daughter.  Does not drive herself.  Advanced directives - HCPOA is son, Silvio Clayman.  Has personal will.  No living will.  States has talked with children about her wishes.  Will work on setting up advanced directive.  Preventative: Endorses recent DEXA at Dr. Jone Baseman office. Thinks gets physical exams in July. Mammogram - 2012, normal. Pap smear - last 1982, s/p hysterectomy 1977.   Colonoscopy - declines. Had one before 2005, looked ok. Doesn't want anymore.  Has had flu shot.  Completed pneumococcal.  Has had tetanus shot, due 2018 Had shingles shot in past. Reviewed blood work with patient.  Medications and allergies reviewed and updated in chart. Patient Active Problem List  Diagnoses  . DIABETES MELLITUS, TYPE II, CONTROLLED, W/NEURO COMPS  . Unspecified essential hypertension    . EMPHYSEMA  . Tobacco abuse  . Arthritis  . Depression  . GERD (gastroesophageal reflux disease)  . Seasonal allergies  . HLD (hyperlipidemia)  . Lung cancer, hx  . Kidney carcinoma, hx  . Neuropathy   Past Medical History  Diagnosis Date  . Arthritis   . Depression   . Diabetes mellitus     with neuropathy  . GERD (gastroesophageal reflux disease)   . Seasonal allergies   . HTN (hypertension)   . HLD (hyperlipidemia)   . Lung cancer 2006    lung adenocarcinoma s/p RULobectomy, CT scans Q6 mo  . Kidney carcinoma 2005    left kidney ?TCC s/p nephrectomy, sole right kidney remains  . DDD (degenerative disc disease), cervical   . Diabetic neuropathy     sent to neurologist  . Hepatic steatosis    Past Surgical History  Procedure Date  . Abdominal hysterectomy 1977    TAH  . Lumbar disc surgery 1950 and 1970s  . Breast excisional biopsy 1970  . Lung cancer surgery 2007    R upper lobectomy, Dr. Edwyna Shell  . Nephrectomy 2004    L removed, Dr. Vernie Ammons  . Spirometry 07/2005    PFTs WNL, good response to bronchodilator  . Cardiac catheterization 10/2004    [place]; nl LV fxn, mod CAD, no focal stenosis   History  Substance Use Topics  . Smoking status: Current Everyday Smoker -- 0.5 packs/day  .  Smokeless tobacco: Not on file   Comment: sometimes more; sometimes less  . Alcohol Use: No   Family History  Problem Relation Age of Onset  . Diabetes Mother   . Hyperlipidemia Mother   . Hypertension Mother   . Heart disease Father   . Early death Father 50    CAD/MI  . Hyperlipidemia Father   . Hypertension Father   . Diabetes Sister   . Stroke Sister   . Heart disease Brother   . Cancer Neg Hx   . Diabetes Sister    Allergies  Allergen Reactions  . Ace Inhibitors Cough  . Penicillins Rash    Pt unsure, thinks can take pcn.  . Sulfonamide Derivatives Rash   Current Outpatient Prescriptions on File Prior to Visit  Medication Sig Dispense Refill  .  albuterol (VENTOLIN HFA) 108 (90 BASE) MCG/ACT inhaler Inhale 2 puffs into the lungs every 4 (four) hours as needed. For cough,wheezing or shortness of breath       . alendronate (FOSAMAX) 35 MG tablet Take 35 mg by mouth every 7 (seven) days. Take with a full glass of water on an empty stomach.       Marland Kitchen amLODipine (NORVASC) 10 MG tablet Take 1 tablet (10 mg total) by mouth daily.  90 tablet  3  . aspirin 81 MG tablet Take 1 tablet (81 mg total) by mouth every other day.      Marland Kitchen atorvastatin (LIPITOR) 40 MG tablet Take 1 tablet (40 mg total) by mouth at bedtime.  90 tablet  3  . Cholecalciferol 1000 UNITS capsule Take 1 capsule (1,000 Units total) by mouth daily.  30 capsule  11  . ergocalciferol (VITAMIN D2) 50000 UNITS capsule Take 50,000 Units by mouth once a week.        . fluticasone (FLONASE) 50 MCG/ACT nasal spray 2 sprays by Nasal route as needed.       . gabapentin (NEURONTIN) 300 MG capsule Take 900 mg by mouth 3 (three) times daily.        Marland Kitchen glucose blood test strip Use as instructed  100 each  12  . metFORMIN (GLUCOPHAGE) 500 MG tablet TAKE 1 TABLET BY MOUTH 2 TIMES A DAY WITH FOOD  60 tablet  6  . potassium chloride SA (K-DUR,KLOR-CON) 20 MEQ tablet Take 20 mEq by mouth daily.        . valsartan-hydrochlorothiazide (DIOVAN-HCT) 320-12.5 MG per tablet Take 1 tablet by mouth daily.  90 tablet  3   Review of Systems Per HPI    Objective:   Physical Exam  Nursing note and vitals reviewed. Constitutional: She appears well-developed and well-nourished. No distress.  HENT:  Head: Normocephalic and atraumatic.  Right Ear: Hearing, tympanic membrane, external ear and ear canal normal.  Left Ear: Hearing, tympanic membrane, external ear and ear canal normal.  Nose: Nose normal. No mucosal edema or rhinorrhea.  Mouth/Throat: Uvula is midline, oropharynx is clear and moist and mucous membranes are normal. No oropharyngeal exudate, posterior oropharyngeal edema, posterior oropharyngeal  erythema or tonsillar abscesses.  Eyes: Conjunctivae and EOM are normal. Pupils are equal, round, and reactive to light. No scleral icterus.  Neck: Normal range of motion. Neck supple. Carotid bruit is not present.  Cardiovascular: Normal rate, regular rhythm, normal heart sounds and intact distal pulses.   No murmur heard. Pulses:      Radial pulses are 2+ on the right side, and 2+ on the left side.  Dorsalis pedis pulses are 2+ on the right side, and 2+ on the left side.       Posterior tibial pulses are 2+ on the right side, and 2+ on the left side.  Pulmonary/Chest: Effort normal and breath sounds normal. No respiratory distress. She has no wheezes. She has no rales. Right breast exhibits no inverted nipple, no mass, no nipple discharge, no skin change and no tenderness. Left breast exhibits no inverted nipple, no mass, no nipple discharge, no skin change and no tenderness. Breasts are symmetrical.  Abdominal: Soft. Bowel sounds are normal. There is no tenderness.  Musculoskeletal: She exhibits no edema.  Lymphadenopathy:    She has no cervical adenopathy.    She has no axillary adenopathy.       Right: No supraclavicular adenopathy present.       Left: No supraclavicular adenopathy present.  Neurological: No sensory deficit.       Monofilament intact bilateral feet/soles  Skin: Skin is warm and dry. No rash noted.       Bilateral feet markedly erythematous  Psychiatric: She has a normal mood and affect.  Diabetic foot exam: Erythematous bilateral feet No skin breakdown No calluses  Normal DP/PT pulses Normal sensation to light tough and monofilament Nails normal, absent on big toes.        Assessment & Plan:

## 2011-02-06 NOTE — Assessment & Plan Note (Addendum)
I have personally reviewed the Medicare Annual Wellness questionnaire and have noted 1. The patient's medical and social history 2. Their use of alcohol, tobacco or illicit drugs 3. Their current medications and supplements 4. The patient's functional ability including ADL's, fall risks, home safety risks and hearing or visual             impairment. 5. Diet and physical activities 6. Evidence for depression or mood disorders  The patients weight, height, BMI and visual acuity have been recorded in the chart I have made referrals, counseling and provided education to the patient based review of the above and I have provided the pt with a written personalized care plan for preventive services.  Asked to get records of dexa, we will also attempt to obtain. Reviewed blood work in detail with patient. Memory screen positive - forgets things, only able to recall 2/3 words.  RTC for geriatric assessment. Breast exam today.  Due for mammo 06/2011.

## 2011-02-06 NOTE — Assessment & Plan Note (Signed)
Encouraged smoking cessation, discussed benefits for legs as well as other health issues.

## 2011-02-06 NOTE — Assessment & Plan Note (Signed)
Stable, not optimal.  Continue meds.  Consider increase in HCTZ. BP Readings from Last 3 Encounters:  02/06/11 136/80  12/05/10 140/70  10/24/10 140/70

## 2011-02-06 NOTE — Assessment & Plan Note (Signed)
Last check A1c elevated to 7.7% (11/2010).   Discussed watching sugar intake. Foot exam today. Advised schedule eye exam. No med changes today.  Continue metformin. RTC next few months for recheck.

## 2011-02-06 NOTE — Patient Instructions (Addendum)
Schedule vision screen as you are due. Hearing and vision screen today. I've increased the number of vicodin to 60 per month, use sparingly. Work on decreasing smoking as this will help with legs. Good to see you today, call us with questions. Return in next few months for geriatric assessment to check on memory. Sugars have been trending up - keep eye on diet, continue metformin. Next mammogram due after 06/2011. I have provided you with a copy of your personalized plan for preventive services. Please take the time to review along with your updated medication list.

## 2011-02-13 ENCOUNTER — Other Ambulatory Visit: Payer: Self-pay | Admitting: Family Medicine

## 2011-02-23 ENCOUNTER — Telehealth: Payer: Self-pay | Admitting: *Deleted

## 2011-02-23 DIAGNOSIS — M79673 Pain in unspecified foot: Secondary | ICD-10-CM

## 2011-02-23 NOTE — Telephone Encounter (Signed)
Pt is asking to be referred to a pain clinic for the neuropathy in her feet- she doesn't have a preference where. She says she is having a lot of problems with her feet hurting and her son has suggested that she see a pain management doctor.

## 2011-02-23 NOTE — Telephone Encounter (Signed)
Ok to do. Will refer.  Routed to [place].

## 2011-03-02 ENCOUNTER — Other Ambulatory Visit: Payer: Self-pay | Admitting: *Deleted

## 2011-03-02 MED ORDER — HYDROCODONE-ACETAMINOPHEN 5-500 MG PO TABS: 1.0000 | ORAL_TABLET | Freq: Two times a day (BID) | ORAL | Status: DC | PRN

## 2011-03-02 NOTE — Telephone Encounter (Signed)
Okay to refill? 

## 2011-03-02 NOTE — Telephone Encounter (Signed)
Ok to refill 

## 2011-03-02 NOTE — Telephone Encounter (Signed)
Rx called in as directed.   

## 2011-03-06 NOTE — Telephone Encounter (Signed)
Shirlee Limerick, have you seen this note?

## 2011-03-07 NOTE — Telephone Encounter (Signed)
Appt made with Dr Metta Clines at [facility], patient aware of appt. MK

## 2011-03-13 ENCOUNTER — Ambulatory Visit: Payer: Self-pay | Admitting: Pain Medicine

## 2011-03-17 HISTORY — PX: OTHER SURGICAL HISTORY: SHX169

## 2011-03-21 ENCOUNTER — Ambulatory Visit: Payer: Self-pay | Admitting: Pain Medicine

## 2011-03-22 ENCOUNTER — Encounter: Payer: Self-pay | Admitting: Family Medicine

## 2011-04-03 ENCOUNTER — Other Ambulatory Visit: Payer: Self-pay | Admitting: *Deleted

## 2011-04-03 MED ORDER — HYDROCODONE-ACETAMINOPHEN 5-500 MG PO TABS: 1.0000 | ORAL_TABLET | Freq: Two times a day (BID) | ORAL | Status: DC | PRN

## 2011-04-03 NOTE — Telephone Encounter (Signed)
Rx called in as directed.   

## 2011-04-03 NOTE — Telephone Encounter (Signed)
Okay to refill? 

## 2011-04-03 NOTE — Telephone Encounter (Signed)
plz phone in and notify pt. 

## 2011-04-04 ENCOUNTER — Telehealth: Payer: Self-pay | Admitting: *Deleted

## 2011-04-04 MED ORDER — OXYCODONE-ACETAMINOPHEN 5-325 MG PO TABS: 1.0000 | ORAL_TABLET | ORAL | Status: DC | PRN

## 2011-04-04 NOTE — Telephone Encounter (Signed)
Pt saw pain management doctor yesterday and was was given a shot in her back.  She says this didn't help at all and she has terrible pain in her feet today.  She has vicodin and is supposed to take 2 a day but most days she takes 3, but today she needs something else.  She cant stand the pain, needs help today.  [facility].

## 2011-04-04 NOTE — Telephone Encounter (Signed)
May try percocets which are stronger narcotics, may cause stomach upset.  Don't take vicodin if taking percocets.  Don't drive with this med.  Sent in #10, only temporary measure while see what pain clinic wants to continue.  Will need to come in to pick up script. I want her to f/u with pain clinic for this as well to see if they have any other recommendations.

## 2011-04-04 NOTE — Telephone Encounter (Signed)
Message left notifying patient of new Rx. Notified her that it may cause upset stomach. Advised her not to take vicodin if she takes percocet and and not to drive if she takes it. Instructed her to call with any questions or concerns and to follow up with pain clinic. Rx placed up front for patient to pick up.

## 2011-04-09 ENCOUNTER — Telehealth: Payer: Self-pay | Admitting: *Deleted

## 2011-04-09 ENCOUNTER — Ambulatory Visit: Payer: Medicare Other | Admitting: Family Medicine

## 2011-04-09 MED ORDER — OXYCODONE-ACETAMINOPHEN 5-325 MG PO TABS: 1.0000 | ORAL_TABLET | Freq: Four times a day (QID) | ORAL | Status: DC | PRN

## 2011-04-09 NOTE — Telephone Encounter (Signed)
When is appt with pain doctor? Pt missed appt this am.

## 2011-04-09 NOTE — Telephone Encounter (Signed)
Notify ready to pick up

## 2011-04-09 NOTE — Telephone Encounter (Signed)
Patient said her pain appt is Thursday and she is coming to see you tomorrow. She is requesting something until then.

## 2011-04-09 NOTE — Telephone Encounter (Signed)
Patient notified. Due to Rx being written after closing, [facility] [place] [facility]. She is aware and will go to pharmacy to pick it up later this evening.

## 2011-04-09 NOTE — Telephone Encounter (Signed)
Pt states she picked up her percocet but now she is out and would like a refill until she sees the pain doctor.  She says she doesn't have anything for pain.  I asked her if she still has vicodin and she said yes, but says this doesn't help.  Please advise.

## 2011-04-10 ENCOUNTER — Ambulatory Visit (INDEPENDENT_AMBULATORY_CARE_PROVIDER_SITE_OTHER): Payer: Medicare Other | Admitting: Family Medicine

## 2011-04-10 ENCOUNTER — Encounter: Payer: Self-pay | Admitting: Family Medicine

## 2011-04-10 DIAGNOSIS — R413 Other amnesia: Secondary | ICD-10-CM

## 2011-04-10 DIAGNOSIS — G244 Idiopathic orofacial dystonia: Secondary | ICD-10-CM

## 2011-04-10 DIAGNOSIS — I10 Essential (primary) hypertension: Secondary | ICD-10-CM

## 2011-04-10 DIAGNOSIS — M79673 Pain in unspecified foot: Secondary | ICD-10-CM

## 2011-04-10 DIAGNOSIS — M79609 Pain in unspecified limb: Secondary | ICD-10-CM

## 2011-04-10 MED ORDER — LOSARTAN POTASSIUM-HCTZ 100-25 MG PO TABS: 1.0000 | ORAL_TABLET | Freq: Every day | ORAL | Status: DC

## 2011-04-10 MED ORDER — LOSARTAN POTASSIUM-HCTZ 100-12.5 MG PO TABS: 1.0000 | ORAL_TABLET | Freq: Every day | ORAL | Status: DC

## 2011-04-10 NOTE — Patient Instructions (Signed)
Stop diovan hct, start losartan hct instead (should be cheaper as it is generic.)  You will need to keep eye on blood pressure as this medicine may not control your blood pressure as well. Return in 1 month for follow up. Keep appointment with pain doctor.

## 2011-04-10 NOTE — Assessment & Plan Note (Signed)
Using percocets to help control pain. Refilled percocets to last until appt Thursday. Not improved with ESI x 1, vicodin, gabapentin. ?erythromelalgia vs burningfeet syndrome (both hereditary).

## 2011-04-10 NOTE — Progress Notes (Signed)
  Subjective:    Patient ID: Marissa Ho, female    DOB: 18-Dec-1931, 77 y.o.   MRN: [id number]  HPI CC: geriatric assessment  Has appt with pain doctor on thursday.  I have been providing percocets while gets in to see pain clinic.  vicodin doesn't help.  Had ESI on left lumbar back x1, didn't really seem to help.  On amlodipine and diovan HCT, requests cheaper med.  Tolerating fine otherwise.  Positive memory screen at medicare wellness visit.  Here today for geriatric assessment.  Pt hasn't noticed any concerns with memory or problems.  Denies problems with uncontrolled movements or tremor.  Review of Systems Per HPI    Objective:   Physical Exam  Nursing note and vitals reviewed. Constitutional: She appears well-developed and well-nourished. No distress.  Neurological: She is alert.       Orofacial dyskinesia  Psychiatric: She has a normal mood and affect. Her behavior is normal. Judgment and thought content normal.  Geriatric Assessment:  Activities of Daily Living:     Bathing- ind    Dressing- ind    Eating- ind    Toileting- ind    Transferring - ind    Continence- ind (no accidents) Overall Assessment: INDEPENDENT  Instrumental Activities of Daily Living:     Transportation- ind    Meal/Food Preparation- partially dependent    Shopping Errands- ind    Housekeeping/Chores- ind     Money Management/Finances- ind    Medication Management- ind    Ability to Use Telephone- ind    Laundry- ind Overall Assessment:  INDEPENDENT  Mental Status Exam: (value/max value)  25/30 (8th grade education, likely 25/25)  Missed calculation, 1/3 recall     Clock Drawing Score: 4/4 Get up and go test: 19 sec  Bottom partial dentures.    Assessment & Plan:

## 2011-04-10 NOTE — Assessment & Plan Note (Signed)
Denies h/o psychotropic meds. Not bothering patient. Continue to monitor. H/o partial lower denture.

## 2011-04-10 NOTE — Assessment & Plan Note (Signed)
Good control. Pt requests change of BP meds to something cheaper - will change diovan to losartan.

## 2011-04-10 NOTE — Assessment & Plan Note (Signed)
Question of memory problem. Geriatric assessment overall negative for dementia (taking into account education level). Overall independent in ADLs and IADLs. Delayed get up and go test.  H/o falls in past.  Will discuss with pt option of PT referral, but likely continue to follow with pain clinic for now. Uses cane for assistance.

## 2011-04-12 ENCOUNTER — Ambulatory Visit: Payer: Self-pay | Admitting: Pain Medicine

## 2011-04-12 ENCOUNTER — Telehealth: Payer: Self-pay | Admitting: *Deleted

## 2011-04-12 ENCOUNTER — Other Ambulatory Visit: Payer: Self-pay

## 2011-04-12 MED ORDER — L-METHYLFOLATE-B6-B12 3-35-2 MG PO TABS: 1.0000 | ORAL_TABLET | Freq: Every day | ORAL | Status: DC

## 2011-04-12 MED ORDER — OXYCODONE-ACETAMINOPHEN 5-325 MG PO TABS: 1.0000 | ORAL_TABLET | Freq: Three times a day (TID) | ORAL | Status: DC | PRN

## 2011-04-12 NOTE — Telephone Encounter (Signed)
Pt seen at pain clinic for foot pain due to neuropathy and was told to continue pain med one more month. 04/03/11 Hydrocodone/APAP 5/500 refill given #65. Pt said she is taking q6h and pt has 2 or 3 pills left. [facility]. Pt contact # [phone].Please advise.

## 2011-04-12 NOTE — Telephone Encounter (Signed)
Can we verify with pharmacy when this was phoned in?  Was #65 phoned in 04/03/2011?  If so will need to check with patient to see which she prefers to use - vicodin or percocets as I don't want to continue filling both.

## 2011-04-12 NOTE — Telephone Encounter (Signed)
Please call pharmacy and cancel vicodin script. Please advise pt percocet script ready to pick up - I want her to try to use as sparingly as possible, f/u with me in 1 mo to f/u pain and sign pain contract. To watch for constipation - may need to take OTC stool softener like colace daily while using percocets.

## 2011-04-12 NOTE — Telephone Encounter (Signed)
Form for patient to hold ASA x 5 days for [facility] with Dr. Metta Clines in your IN box

## 2011-04-12 NOTE — Telephone Encounter (Signed)
Filled out and handed back to [employer].

## 2011-04-12 NOTE — Telephone Encounter (Signed)
Form faxed back to Dr. Crisp's office. 

## 2011-04-12 NOTE — Telephone Encounter (Signed)
Rx cancelled with pharmacy. Patient notified of new Rx and to use sparingly. Advised she may need stool softener. She already has a 1 month follow up scheduled. Rx up front for patient to pick up.

## 2011-04-12 NOTE — Telephone Encounter (Signed)
Spoke with pharmacy. The vicodin was called in on the 18th, but the patient did not pick it up. I spoke with the patient and she said the percocet was working much better. She prefers a refill on that for 1 month. She said the pain doctor doesn't want to prescribe it along with you and he preferred that you write for it since you already have been.

## 2011-04-18 ENCOUNTER — Ambulatory Visit: Payer: Self-pay | Admitting: Pain Medicine

## 2011-04-26 ENCOUNTER — Ambulatory Visit: Payer: Medicare Other

## 2011-04-30 ENCOUNTER — Encounter: Payer: Self-pay | Admitting: Family Medicine

## 2011-04-30 ENCOUNTER — Ambulatory Visit (INDEPENDENT_AMBULATORY_CARE_PROVIDER_SITE_OTHER): Payer: Medicare Other | Admitting: Family Medicine

## 2011-04-30 DIAGNOSIS — R011 Cardiac murmur, unspecified: Secondary | ICD-10-CM

## 2011-04-30 DIAGNOSIS — I10 Essential (primary) hypertension: Secondary | ICD-10-CM

## 2011-04-30 MED ORDER — VALSARTAN-HYDROCHLOROTHIAZIDE 320-12.5 MG PO TABS: 1.0000 | ORAL_TABLET | Freq: Every day | ORAL | Status: DC

## 2011-04-30 NOTE — Assessment & Plan Note (Signed)
Deteriorated with change from diovan to losartan. Pt requests change back, understands change in cost. Restart diovan hct at previous dose (previously well controlled on this dose. Advised to call me if bp staying elevated.

## 2011-04-30 NOTE — Assessment & Plan Note (Signed)
New murmur, ?AS. No other sxs. Monitor for now.  If continued next visit (1 mo) with normal bp, order echo. Advised to notify us if any other sxs including SOB, CP, dizziness, fever.

## 2011-04-30 NOTE — Patient Instructions (Signed)
Cancel appointment 05/10/2011. Return to see me in 1 month. We will recheck murmur then (hopefully better blood pressure control.) If murmur staying, I will get ultrasound of heart. In meantime, let me know if any shortness of breath, chest pain or tightness, leg swelling, dizziness, or feelings like you're going to pass out. Restart diovan HCT and stop hyzaar. Good to see you today, call us with questions.

## 2011-04-30 NOTE — Progress Notes (Signed)
  Subjective:    Patient ID: Marissa Ho, female    DOB: 02/14/1932, 77 y.o.   MRN: [id number]  HPI CC: HTN  Here requesting changes on bp meds.  On norvasc 10mg  and hyzaar 100/12.5mg  daily.  Recently changed from diovan hct to hyzaar 2/2 cheaper alternative.  bp has been running elevated 130-190/60-90s.  Last night and today feeling like "train in head"  This has made her very worried as strokes run in family.  No HA, vision changes, CP/tightness, SOB, leg swelling.  No change in stamina, no fatigue.  No dizziness, palpitations.  Had 2nd ESI in back, caused severe pain and doesn't want repeated.  percocets helping better for foot pain.  "doing pretty good".  Past Medical History  Diagnosis Date  . Arthritis   . Depression   . Diabetes mellitus     with neuropathy  . GERD (gastroesophageal reflux disease)   . Seasonal allergies   . HTN (hypertension)   . HLD (hyperlipidemia)   . Lung cancer 2006    lung adenocarcinoma s/p RULobectomy, CT scans Q6 mo  . Kidney carcinoma 2005    left kidney ?TCC s/p nephrectomy, sole right kidney remains  . DDD (degenerative disc disease), cervical   . Diabetic neuropathy     sent to neurologist  . Hepatic steatosis    Review of Systems Per HPI    Objective:   Physical Exam  Nursing note and vitals reviewed. Constitutional: She appears well-developed and well-nourished. No distress.  HENT:  Head: Normocephalic and atraumatic.  Mouth/Throat: Oropharynx is clear and moist. No oropharyngeal exudate.  Eyes: Conjunctivae and EOM are normal. Pupils are equal, round, and reactive to light. No scleral icterus.  Neck: Normal range of motion. Neck supple. Carotid bruit is not present.  Cardiovascular: Normal rate, regular rhythm and intact distal pulses.   Murmur (3/6 mid SEM best at LUSB, radiation to carotids) heard. Pulmonary/Chest: Effort normal and breath sounds normal. No respiratory distress. She has no wheezes. She has no rales.    Abdominal: Soft. There is no tenderness.  Musculoskeletal: She exhibits no edema.  Lymphadenopathy:    She has no cervical adenopathy.  Skin: Skin is warm and dry. No rash noted.  Psychiatric: She has a normal mood and affect.      Assessment & Plan:

## 2011-04-30 NOTE — Progress Notes (Signed)
Addended by: Eustaquio Boyden on: 04/30/2011 09:31 PM   Modules accepted: Level of Service

## 2011-05-10 ENCOUNTER — Ambulatory Visit: Payer: Medicare Other | Admitting: Family Medicine

## 2011-05-11 ENCOUNTER — Telehealth: Payer: Self-pay | Admitting: *Deleted

## 2011-05-11 MED ORDER — OXYCODONE-ACETAMINOPHEN 5-325 MG PO TABS: 1.0000 | ORAL_TABLET | Freq: Three times a day (TID) | ORAL | Status: DC | PRN

## 2011-05-11 NOTE — Telephone Encounter (Signed)
Just received this note.  plz notify pt ready to pick up.

## 2011-05-11 NOTE — Telephone Encounter (Signed)
Patient aware and Rx placed up front for pick up. 

## 2011-05-11 NOTE — Telephone Encounter (Signed)
Pt is requesting a refill on endocet, she says she last got it filled on 9/27. I dont see this on med list.

## 2011-05-14 ENCOUNTER — Telehealth: Payer: Self-pay | Admitting: Family Medicine

## 2011-05-14 ENCOUNTER — Ambulatory Visit: Payer: Self-pay | Admitting: Pain Medicine

## 2011-05-14 NOTE — Telephone Encounter (Signed)
Royston Cowper at Dr. Metta Clines, Pain Management, called to ask Dr. Sharen Hones if pain meds can be started and if aspirin can be stopped 5 days prior to procedure next week?  The call back number is [phone].

## 2011-05-14 NOTE — Telephone Encounter (Signed)
Will forward to Dr. Reece Agar

## 2011-05-14 NOTE — Telephone Encounter (Signed)
Ok to do this.  plz notify.

## 2011-05-15 ENCOUNTER — Encounter: Payer: Self-pay | Admitting: Family Medicine

## 2011-05-15 NOTE — Telephone Encounter (Signed)
Form faxed to Dr. Letta Moynahan office and patient notified.

## 2011-05-15 NOTE — Telephone Encounter (Signed)
Dr. Metta Clines Pain Management also requests taking over pain med administration for patient.  Ok'd to do.  plz notify patient.

## 2011-05-17 HISTORY — PX: OTHER SURGICAL HISTORY: SHX169

## 2011-05-17 HISTORY — PX: US ECHOCARDIOGRAPHY: HXRAD669

## 2011-05-21 ENCOUNTER — Ambulatory Visit: Payer: Self-pay | Admitting: Pain Medicine

## 2011-05-28 ENCOUNTER — Emergency Department (HOSPITAL_COMMUNITY)
Admission: EM | Admit: 2011-05-28 | Discharge: 2011-05-28 | Disposition: A | Discharge status: Home / Self Care | Payer: Medicare Other | Attending: Emergency Medicine | Admitting: Emergency Medicine

## 2011-05-28 ENCOUNTER — Encounter (HOSPITAL_COMMUNITY): Payer: Self-pay | Admitting: *Deleted

## 2011-05-28 DIAGNOSIS — Z85528 Personal history of other malignant neoplasm of kidney: Secondary | ICD-10-CM | POA: Insufficient documentation

## 2011-05-28 DIAGNOSIS — M542 Cervicalgia: Secondary | ICD-10-CM | POA: Insufficient documentation

## 2011-05-28 DIAGNOSIS — I10 Essential (primary) hypertension: Secondary | ICD-10-CM | POA: Insufficient documentation

## 2011-05-28 DIAGNOSIS — M62838 Other muscle spasm: Secondary | ICD-10-CM | POA: Insufficient documentation

## 2011-05-28 DIAGNOSIS — E1149 Type 2 diabetes mellitus with other diabetic neurological complication: Secondary | ICD-10-CM | POA: Insufficient documentation

## 2011-05-28 MED ORDER — DIAZEPAM 10 MG PO TABS: 10.0000 mg | ORAL_TABLET | Freq: Four times a day (QID) | ORAL | Status: DC | PRN

## 2011-05-28 MED ORDER — ONDANSETRON 8 MG PO TBDP
8.0000 mg | ORAL_TABLET | Freq: Once | ORAL | Status: AC
  Administered 2011-05-28: 8 mg via ORAL
  Filled 2011-05-28: qty 1

## 2011-05-28 MED ORDER — HYDROMORPHONE HCL PF 1 MG/ML IJ SOLN
2.0000 mg | Freq: Once | INTRAMUSCULAR | Status: AC
  Administered 2011-05-28: 2 mg via INTRAMUSCULAR
  Filled 2011-05-28: qty 2

## 2011-05-28 MED ORDER — HYDROMORPHONE HCL 2 MG PO TABS: 2.0000 mg | ORAL_TABLET | ORAL | Status: AC | PRN

## 2011-05-28 MED ORDER — PREDNISONE 20 MG PO TABS
60.0000 mg | ORAL_TABLET | Freq: Once | ORAL | Status: AC
  Administered 2011-05-28: 60 mg via ORAL
  Filled 2011-05-28: qty 3

## 2011-05-28 MED ORDER — PREDNISONE (PAK) 10 MG PO TABS: ORAL_TABLET | ORAL | Status: AC

## 2011-05-28 NOTE — ED Notes (Signed)
Pt ate approx. 80% of breakfast tray.

## 2011-05-28 NOTE — ED Notes (Signed)
Family at bedside. 

## 2011-05-28 NOTE — ED Notes (Signed)
Pt c/o neck pain after receiving a steroid shot x 1 week ago after a fall. Pt states unrelieved pain.

## 2011-05-28 NOTE — ED Provider Notes (Signed)
History     CSN: [id number] Arrival date & time: 05/28/2011  3:36 AM   First MD Initiated Contact with Patient 05/28/11 [phone]      Chief Complaint  Patient presents with  . Neck Pain    ongoing neck pain x 1 week. Pt has chronic pain.    (Consider location/radiation/quality/duration/timing/severity/associated sxs/prior treatment) Patient is a 77 y.o. female presenting with neck pain. The history is provided by the patient and a relative.  Neck Pain  This is a chronic (She has been scheduled for an MRI, which will be done in 2 days. The) problem. The current episode started more than 1 week ago. The problem occurs constantly. The problem has been gradually worsening. The pain is associated with nothing. There has been no fever. The pain is present in the right side. The quality of the pain is described as stabbing, aching and shooting. The pain is moderate. The symptoms are aggravated by bending and twisting. Pertinent negatives include no photophobia, no visual change, no chest pain, no numbness and no leg pain. She has tried nothing for the symptoms. The treatment provided no relief.    Past Medical History  Diagnosis Date  . Arthritis   . Depression   . Diabetes mellitus     with neuropathy  . GERD (gastroesophageal reflux disease)   . Seasonal allergies   . HTN (hypertension)   . HLD (hyperlipidemia)   . Lung cancer 2006    lung adenocarcinoma s/p RULobectomy, CT scans Q6 mo  . Kidney carcinoma 2005    left kidney ?TCC s/p nephrectomy, sole right kidney remains  . DDD (degenerative disc disease), cervical   . Diabetic neuropathy     sent to neurologist, pain management to prescribe narcotics.  . Hepatic steatosis     Past Surgical History  Procedure Date  . Abdominal hysterectomy 1977    TAH  . Lumbar disc surgery 1950 and 1970s  . Breast excisional biopsy 1970  . Lung cancer surgery 2007    R upper lobectomy, Dr. Edwyna Shell  . Nephrectomy 2004    L removed, Dr.  Vernie Ammons  . Spirometry 07/2005    PFTs WNL, good response to bronchodilator  . Cardiac catheterization 10/2004    [place]; nl LV fxn, mod CAD, no focal stenosis  . Lumbar sympathetic block 03/2011    left sided L2-4 for diabetic neuropathy    Family History  Problem Relation Age of Onset  . Diabetes Mother   . Hyperlipidemia Mother   . Hypertension Mother   . Heart disease Father   . Early death Father 43    CAD/MI  . Hyperlipidemia Father   . Hypertension Father   . Diabetes Sister   . Stroke Sister   . Heart disease Brother   . Cancer Neg Hx   . Diabetes Sister     History  Substance Use Topics  . Smoking status: Current Everyday Smoker -- 0.5 packs/day  . Smokeless tobacco: Not on file   Comment: sometimes more; sometimes less  . Alcohol Use: No    OB History    Grav Para Term Preterm Abortions TAB SAB Ect Mult Living                  Review of Systems  HENT: Positive for neck pain.   Eyes: Negative for photophobia.  Cardiovascular: Negative for chest pain.  Neurological: Negative for numbness.    Allergies  Ace inhibitors; Penicillins; and Sulfonamide derivatives  Home Medications  Current Outpatient Rx  Name Route Sig Dispense Refill  . ALENDRONATE SODIUM 35 MG PO TABS  TAKE 1 TABLET BY MOUTH ONCE A WEEK 4 tablet 6  . AMLODIPINE BESYLATE 10 MG PO TABS Oral Take 1 tablet (10 mg total) by mouth daily. 90 tablet 3  . ASPIRIN 81 MG PO TABS Oral Take 1 tablet (81 mg total) by mouth every other day.    . ATORVASTATIN CALCIUM 40 MG PO TABS Oral Take 1 tablet (40 mg total) by mouth at bedtime. 90 tablet 3  . CHOLECALCIFEROL 1000 UNITS PO CAPS Oral Take 1 capsule (1,000 Units total) by mouth daily. 30 capsule 11  . FLUTICASONE PROPIONATE 50 MCG/ACT NA SUSP Nasal 2 sprays by Nasal route as needed.     Marland Kitchen GLUCOSE BLOOD VI STRP  Use as instructed 100 each 12  . FREESTYLE LANCETS MISC  Use as instructed 100 each 12  . POTASSIUM CHLORIDE CRYS CR 20 MEQ PO TBCR Oral  Take 20 mEq by mouth daily.      Marland Kitchen VALSARTAN-HYDROCHLOROTHIAZIDE 320-12.5 MG PO TABS Oral Take 1 tablet by mouth daily. 90 tablet 3  . ALBUTEROL SULFATE HFA 108 (90 BASE) MCG/ACT IN AERS Inhalation Inhale 2 puffs into the lungs every 4 (four) hours as needed. For cough,wheezing or shortness of breath     . DIAZEPAM 10 MG PO TABS Oral Take 1 tablet (10 mg total) by mouth every 6 (six) hours as needed for anxiety. 20 tablet 0  . GABAPENTIN 300 MG PO CAPS Oral Take 900 mg by mouth 3 (three) times daily.      Marland Kitchen HYDROMORPHONE HCL 2 MG PO TABS Oral Take 1 tablet (2 mg total) by mouth every 4 (four) hours as needed for pain (pain). 30 tablet 0  . PREDNISONE (PAK) 10 MG PO TABS  Take in decreasing daily dose 6, 5, 4, 3, 2, 1 21 tablet 0    BP 111/62  Pulse 70  Resp 13  SpO2 96%  Physical Exam  Constitutional: She is oriented to person, place, and time. She appears well-developed and well-nourished.  HENT:  Head: Normocephalic and atraumatic.  Eyes: EOM are normal.  Neck: Normal range of motion. Neck supple.  Cardiovascular: Normal rate and regular rhythm.   Pulmonary/Chest: Effort normal and breath sounds normal.  Musculoskeletal: She exhibits no edema.       Right lateral neck is tender with palpable spasm of the trapezius muscle. There is decreased range of motion of the neck due to  pain.  Neurological: She is alert and oriented to person, place, and time. No cranial nerve deficit. Coordination normal.  Skin: Skin is warm.  Psychiatric: She has a normal mood and affect. Her behavior is normal. Judgment and thought content normal.    ED Course  Procedures (including critical care time) Emergency department treatment. IM Dilaudid ann prednisone-patient had significant improvement in her pain.  No results found.   1. Neck pain       MDM  Neck pain, likely due to degenerative joint disease. Doubt spinal stenosis or fracture.  Plan medications changed; stop Norflex and oxycodone;  prescribed, Dilaudid, and Valium        Flint Melter, MD 05/28/11 [phone]

## 2011-05-28 NOTE — ED Notes (Addendum)
Heat pack given to patient to help with the pain in her neck.

## 2011-05-30 ENCOUNTER — Ambulatory Visit: Payer: Self-pay | Admitting: Pain Medicine

## 2011-05-31 ENCOUNTER — Ambulatory Visit (INDEPENDENT_AMBULATORY_CARE_PROVIDER_SITE_OTHER): Payer: Medicare Other | Admitting: Family Medicine

## 2011-05-31 ENCOUNTER — Encounter: Payer: Self-pay | Admitting: Family Medicine

## 2011-05-31 DIAGNOSIS — E1149 Type 2 diabetes mellitus with other diabetic neurological complication: Secondary | ICD-10-CM

## 2011-05-31 DIAGNOSIS — I10 Essential (primary) hypertension: Secondary | ICD-10-CM

## 2011-05-31 DIAGNOSIS — M79673 Pain in unspecified foot: Secondary | ICD-10-CM

## 2011-05-31 DIAGNOSIS — M5030 Other cervical disc degeneration, unspecified cervical region: Secondary | ICD-10-CM

## 2011-05-31 DIAGNOSIS — R269 Unspecified abnormalities of gait and mobility: Secondary | ICD-10-CM

## 2011-05-31 DIAGNOSIS — R2689 Other abnormalities of gait and mobility: Secondary | ICD-10-CM

## 2011-05-31 DIAGNOSIS — R011 Cardiac murmur, unspecified: Secondary | ICD-10-CM

## 2011-05-31 DIAGNOSIS — M79609 Pain in unspecified limb: Secondary | ICD-10-CM

## 2011-05-31 NOTE — Assessment & Plan Note (Signed)
Chronic, improved. Better control on diovan HCT. Continue meds.

## 2011-05-31 NOTE — Assessment & Plan Note (Signed)
Advised to keep eye on sugars as started on steroid taper by ER.

## 2011-05-31 NOTE — Assessment & Plan Note (Signed)
Still present - will obtain echo to further evaluate (had not heard prior to last month) ?AS

## 2011-05-31 NOTE — Progress Notes (Signed)
Addended by: Eustaquio Boyden on: 05/31/2011 02:01 PM   Modules accepted: Orders, Medications

## 2011-05-31 NOTE — Progress Notes (Signed)
Subjective:    Patient ID: Marissa Ho, female    DOB: 08/11/1931, 77 y.o.   MRN: [id number]  HPI CC: neck pain  Seen here 1 mo ago with elevated BP, changed back to diovan hct (although more expensive) with improved control.  States much better control with this.  Also found murmur last visit, if heard again plan was to order echo.  Bad neck pain, known cervical DDD - 2 nights ago pain got so severe that called ambulance and taken to [place], given Dilaudid and prednisone which helped.  Records reviewed.  Started on dilaudid, valium, and prednisone taper from ER.  Other pain meds stopped.  States Dr. Metta Clines ordered MRI yesterday.  No results available yet.  Dr. Metta Clines (pain management) has taken over narcotics.  Neck hurts right lateral and midline.  Constant sharp pain.  No radiculopathy down arms (pain, numbness, tingling).  Denies weakness, fevers/chills.  No leg sxs.  Foot pain attributed to diabetic neuropathy vs burning foot syndrome doing well since ESI.  Has had 2 falls since last month - fell in kitchen once and fell out of bed 2 nights ago - this is when neck pain started.  MRI done yesterday.  Tried to get into pain management, unable to until early December.    Gait imbalance - recently worsening.  Feels unsteady on feet.  Walks with walker at home, cane when goes out of house.  Recently saw Dr. Vernie Ammons - urology.  kidneys and bladder looked good.  Returns to him in 1 year.  Past Medical History  Diagnosis Date  . Arthritis   . Depression   . Diabetes mellitus     with neuropathy  . GERD (gastroesophageal reflux disease)   . Seasonal allergies   . HTN (hypertension)   . HLD (hyperlipidemia)   . Lung cancer 2006    lung adenocarcinoma s/p RULobectomy, CT scans Q6 mo  . Kidney carcinoma 2005    left kidney ?TCC s/p nephrectomy, sole right kidney remains  . DDD (degenerative disc disease), cervical   . Diabetic neuropathy     sent to neurologist, pain management  to prescribe narcotics.  . Hepatic steatosis    Review of Systems Per HPI    Objective:   Physical Exam  Nursing note and vitals reviewed. Constitutional: She appears well-developed and well-nourished. No distress.  HENT:  Head: Normocephalic and atraumatic.  Mouth/Throat: Oropharynx is clear and moist. No oropharyngeal exudate.  Eyes: Conjunctivae and EOM are normal. Pupils are equal, round, and reactive to light. No scleral icterus.  Neck: Normal range of motion. Neck supple. Carotid bruit is not present.  Cardiovascular: Normal rate, regular rhythm and intact distal pulses.   Murmur (3/6 SEM) heard. Pulmonary/Chest: Effort normal and breath sounds normal. No respiratory distress. She has no wheezes. She has no rales.  Abdominal: Soft. There is no tenderness.  Musculoskeletal: She exhibits no edema.       Limited lateral flexion to right 2/2 pain. Neg spurling test No midline spine tenderness. ++ tight trapezius right side as well as tender to palpation  Lymphadenopathy:    She has no cervical adenopathy.  Neurological: She has normal strength. No sensory deficit. She displays a negative Romberg sign. Coordination and gait abnormal.  Reflex Scores:      Bicep reflexes are 2+ on the right side and 2+ on the left side.      Brachioradialis reflexes are 2+ on the right side and 2+ on the left  side.      Grip strength intact Unable to do tandem stance, unsteady with foot side by side stance Walking with cane.  Skin: Skin is warm and dry. No rash noted.  Psychiatric: She has a normal mood and affect.      Assessment & Plan:

## 2011-05-31 NOTE — Patient Instructions (Addendum)
I will try and obtain results of MRI and then possibly refer you to physica therapy. Continue meds as up to now.  ---> stop valium. I would to order ultrasound of heart as I'm hearing a new murmur. Blood pressure is doing much better. Keep eye on sugars with prednisone use.  Also use heating pad/ice to neck. Pass by marion's office to schedule physical therapy for balance assessment and echo of heart.

## 2011-05-31 NOTE — Assessment & Plan Note (Signed)
Recently seen at ER after fall with neck pain.  Records reviewed.  No imaging obtained.  treated with prednisone, dilaudid and valium.  Did have neck MRI done yesterday.  Would want to refer to PT but will want MRI report first.  Will try and obtain copy.   Has f/u with pain management next month.   Exam/story without red flags.  To notify us if worsening.

## 2011-05-31 NOTE — Assessment & Plan Note (Signed)
Significant improvement after ESI

## 2011-05-31 NOTE — Assessment & Plan Note (Signed)
Recently worsened imbalance.  Unsteady stance today. Anticipate multifactorial dysequilibrium. ?due to meds (on narcotics and valium). Will stop valium for now given concern for accumulation in body. As has had several falls in last month, will refer to PT for balance training and ambulatory assistive device fitting.

## 2011-06-05 ENCOUNTER — Other Ambulatory Visit: Payer: Self-pay | Admitting: Cardiology

## 2011-06-05 DIAGNOSIS — R011 Cardiac murmur, unspecified: Secondary | ICD-10-CM

## 2011-06-06 ENCOUNTER — Emergency Department: Payer: Self-pay | Admitting: *Deleted

## 2011-06-08 ENCOUNTER — Other Ambulatory Visit (INDEPENDENT_AMBULATORY_CARE_PROVIDER_SITE_OTHER): Payer: Medicare Other | Admitting: *Deleted

## 2011-06-08 DIAGNOSIS — R011 Cardiac murmur, unspecified: Secondary | ICD-10-CM

## 2011-06-08 DIAGNOSIS — I359 Nonrheumatic aortic valve disorder, unspecified: Secondary | ICD-10-CM

## 2011-06-10 ENCOUNTER — Encounter: Payer: Self-pay | Admitting: Family Medicine

## 2011-06-15 ENCOUNTER — Telehealth: Payer: Self-pay | Admitting: *Deleted

## 2011-06-15 MED ORDER — TRAMADOL HCL 50 MG PO TABS: 50.0000 mg | ORAL_TABLET | Freq: Four times a day (QID) | ORAL | Status: AC | PRN

## 2011-06-15 MED ORDER — MELOXICAM 15 MG PO TABS: ORAL_TABLET | ORAL | Status: DC

## 2011-06-15 NOTE — Telephone Encounter (Signed)
Spoke with patient.  Off all pain meds currently. Reviewed MRI with pt.  Significant DDD, arthritis with nerve root compression, T1 edema unsure where this is coming from.  Pt denies fevers.  Advised reasonable to have pt see neurology, may wait until visit with Dr. Metta Clines 12/4 to discuss. Recently went to ER.  Treated with tramadol and ketorolac for pain. will send in tramadol and meloxicam.

## 2011-06-15 NOTE — Telephone Encounter (Signed)
Pt called very upset she has been c/o neck pain for a while but having severe pain now.  Was referred to Dr Metta Clines for pain management she has seen him and had injections in her neck and was given a muscle relaxer. She has gone to ER x2 in past 2 weeks due to neck pain. Has been in contact with Dr Letta Moynahan office several times and the last time was advised to call pcp and inform you that Dr Metta Clines has not taken over her pain management yet. She recently had MRI was given results over phone by Dr Letta Moynahan nurse, was told she had a bulging disc, but pt she did not understand the rest of the results. Pt says she is being referred to neuro, but does not know why. She has an appt with Dr Metta Clines 06/19/11, but is requesting pain med to help until he sees her. She also wanted you to explain her MRI results to her.  I called Dr Letta Moynahan office and faxed release for recent records and MRI report.

## 2011-06-17 ENCOUNTER — Encounter: Payer: Self-pay | Admitting: Family Medicine

## 2011-06-19 ENCOUNTER — Ambulatory Visit: Payer: Self-pay | Admitting: Pain Medicine

## 2011-06-21 ENCOUNTER — Telehealth: Payer: Self-pay | Admitting: Family Medicine

## 2011-06-21 ENCOUNTER — Ambulatory Visit: Payer: Medicare Other | Admitting: Family Medicine

## 2011-06-21 ENCOUNTER — Emergency Department: Payer: Self-pay | Admitting: *Deleted

## 2011-06-21 ENCOUNTER — Telehealth: Payer: Self-pay | Admitting: Internal Medicine

## 2011-06-21 NOTE — Telephone Encounter (Signed)
Pt had an appointment today. She called and cancelled because she went to the ER for neck and arm pain...cdavis

## 2011-06-21 NOTE — Telephone Encounter (Signed)
Patient called and stated she is having neck pain and made her appointment for today.

## 2011-06-22 NOTE — Telephone Encounter (Signed)
Noted  

## 2011-06-29 ENCOUNTER — Telehealth: Payer: Self-pay | Admitting: Internal Medicine

## 2011-06-29 MED ORDER — OXYCODONE-ACETAMINOPHEN 5-500 MG PO TABS: 1.0000 | ORAL_TABLET | Freq: Four times a day (QID) | ORAL | Status: DC | PRN

## 2011-06-29 NOTE — Telephone Encounter (Signed)
Seen by pain management (Dr. Metta Clines) - not given any pain medications.  She does not want to return to see him.  Refused to see neurosurgery. Has appt with ortho 12/21. Placed on oxycodone 5/500 in ER which helped significantly. Will provide with script for percocets. Pt has appt with me on Monday. Pt will come to pick up script.

## 2011-06-29 NOTE — Telephone Encounter (Signed)
Patient called and stated she is having neck pain going down her left arm and she can't hardly move her right arm.  She was also seen in the emergency room for this.  She wants a pain med Rx for this pain.  Please advise.

## 2011-07-02 ENCOUNTER — Ambulatory Visit (INDEPENDENT_AMBULATORY_CARE_PROVIDER_SITE_OTHER): Payer: Medicare Other | Admitting: Family Medicine

## 2011-07-02 ENCOUNTER — Encounter: Payer: Self-pay | Admitting: Family Medicine

## 2011-07-02 VITALS — BP 142/78 | HR 88 | Temp 98.2°F | Wt 151.8 lb

## 2011-07-02 DIAGNOSIS — E1149 Type 2 diabetes mellitus with other diabetic neurological complication: Secondary | ICD-10-CM

## 2011-07-02 DIAGNOSIS — M5030 Other cervical disc degeneration, unspecified cervical region: Secondary | ICD-10-CM

## 2011-07-02 MED ORDER — KETOROLAC TROMETHAMINE 60 MG/2ML IM SOLN
30.0000 mg | Freq: Once | INTRAMUSCULAR | Status: AC
  Administered 2011-07-02: 30 mg via INTRAMUSCULAR

## 2011-07-02 MED ORDER — PREDNISONE 20 MG PO TABS: ORAL_TABLET | ORAL | Status: DC

## 2011-07-02 MED ORDER — GABAPENTIN 300 MG PO CAPS: 900.0000 mg | ORAL_CAPSULE | Freq: Three times a day (TID) | ORAL | Status: DC

## 2011-07-02 NOTE — Patient Instructions (Addendum)
Return in 1 month to discuss diabetes, come in a few days prior fasting for blood work. For pain - take percocets as prescribed.  Take prednisone tapering course for next 6 days.  Shot of toradol today. For the neck, I would like to set you up with neurosurgeon for evaluation.  Pass by Marion's office to set this up.  We want to get you in to see neurosurgeon as soon as possible.

## 2011-07-02 NOTE — Progress Notes (Signed)
  Subjective:    Patient ID: Marissa Ho, female    DOB: 26-Nov-1931, 77 y.o.   MRN: [id number]  HPI CC: neck pain  Presents with daughter, Denzil Magnuson who has moved in with mom to help her 2/2 neck/arm pain.  Pleasant 77 yo with h/o T2DM, HTN, HLD, emphysema, continued smoker, h/o lung and kidney cancer with sole right kidney remaining, with known DDD by MRI 05/2011 who was followed by pain management last few months until recently presents for f/u neck and arm pain.  Continued neck soreness and shoulder/arm pain on left.  Pain travels down left arm and endorses left hand numbness.  "I can't stand this pain".  Keeping ice pack on shoulder.  Taking percocets 5/500 1 every 6 hours, not controlling pain.  Endorsing weakness worse on left as well.  Right side seems to be doing all right.  Has had cervical ESI by Dr. Metta Clines, didn't help.    Has tried cervical collar which helped some but told by Dr. Metta Clines to stop using.  Has been to ER 3-4 times in last few weeks.  No falls recently.  Brings all meds she's taking currently.  Taking percocet 5 500 q6 hours daily.  MRI cervical spine w/o contrast (05/30/2011) - multilevel DDD with areas of thecal sac stenosis and neural foraminal narrowing, concern for bilateral exiting nerve root compression, possible T1 edema but no evidence of vertebral body height loss.  Has appointment with ortho 07/06/2011.  Does not want to return to pain management.  Pt very hesitant about surgery - states told by orthopedist years ago that should never have neck pain, however at that time was not having severe pain like currently.  Review of Systems Per HPI    Objective:   Physical Exam  Nursing note and vitals reviewed. Constitutional: She appears well-developed and well-nourished. No distress.  Musculoskeletal:       Midline cervical neck tenderness to palpation. Decreased ROM neck left flexion, rotation 2/2 pain. Mild decreased grip strength on left  Neurological: A  sensory deficit is present.  Reflex Scores:      Bicep reflexes are 2+ on the right side and 2+ on the left side.      No clonus. Tender to all movements of left upper extremity. Endorses subjective numbness left hand dorsal and palmar all 5 fingers, temperature discrimination intact. decreased strength RUE 2/2 pain Neg l'hermitte Continued orofacial dyskinesia       Assessment & Plan:

## 2011-07-02 NOTE — Assessment & Plan Note (Addendum)
Deteriorated.  New left sided radiculopathy. Known mod-severe DDD cervical spine with nerve root compromise. Does not want to return to pain management Dr. Metta Clines. S/p ESI x1, did not help neck pain. Refer to neurosurgery, will try and expedite referral. Treat with prednisone course (watch sugars) and continue percocets 5/500 Q6 prn. On gabapentin 900 tid. Pt hesitant about nsg eval, but I do think merits this, esp given ?T1 vertebral body edema.  No fevers/chills. Toradol 30mg  IM today.

## 2011-07-03 ENCOUNTER — Telehealth: Payer: Self-pay | Admitting: Internal Medicine

## 2011-07-03 NOTE — Telephone Encounter (Signed)
No I'd rather she start the prednisone taper as that medicine (toradol) can adversely affect the kidneys and we have to be careful with her h/o sole kidney. Do prednisone taper and update me on how she's doing. Ensure getting plenty of water to drink.

## 2011-07-03 NOTE — Telephone Encounter (Signed)
Message left notifying patient to take the prednisone and to call me with an update after completion. Advised toradol can affect her kidney, so that needed to be used very sparingly. Advised to drink plenty of water and to call me with any questions or problems.

## 2011-07-03 NOTE — Telephone Encounter (Signed)
Patient called and stated the Toradol shot really helped and wanted to know if she can come in and get another shot.  Please advise.

## 2011-07-05 ENCOUNTER — Other Ambulatory Visit: Payer: Self-pay | Admitting: Internal Medicine

## 2011-07-05 MED ORDER — BLOOD GLUCOSE METER KIT: PACK | Status: AC

## 2011-07-05 MED ORDER — OXYCODONE-ACETAMINOPHEN 5-500 MG PO TABS: 1.0000 | ORAL_TABLET | Freq: Four times a day (QID) | ORAL | Status: AC | PRN

## 2011-07-05 NOTE — Telephone Encounter (Signed)
May pick up new script. Thanks for sending in glucometer script.

## 2011-07-05 NOTE — Telephone Encounter (Signed)
Patient called and stated she needed a refill on her Oxycodone last refill was 12.14.12 she stated she is taking them q6hrs.  Also wanted you to know that her blood sugar is running 198 this morning,  118 yesterday.

## 2011-07-05 NOTE — Telephone Encounter (Signed)
Patient notified and Rx placed up front for pick up. 

## 2011-07-05 NOTE — Telephone Encounter (Signed)
Also she stated her glucose meter is old and she has had for a couple of years and would like to get a new  Rx faxed to her pharmacy.

## 2011-07-12 ENCOUNTER — Encounter (HOSPITAL_COMMUNITY): Payer: Self-pay | Admitting: Emergency Medicine

## 2011-07-12 ENCOUNTER — Emergency Department (HOSPITAL_COMMUNITY)
Admission: EM | Admit: 2011-07-12 | Discharge: 2011-07-12 | Disposition: A | Discharge status: Home / Self Care | Payer: Medicare Other | Attending: Emergency Medicine | Admitting: Emergency Medicine

## 2011-07-12 DIAGNOSIS — E119 Type 2 diabetes mellitus without complications: Secondary | ICD-10-CM | POA: Insufficient documentation

## 2011-07-12 DIAGNOSIS — Z85118 Personal history of other malignant neoplasm of bronchus and lung: Secondary | ICD-10-CM | POA: Insufficient documentation

## 2011-07-12 DIAGNOSIS — Z85528 Personal history of other malignant neoplasm of kidney: Secondary | ICD-10-CM | POA: Insufficient documentation

## 2011-07-12 DIAGNOSIS — I10 Essential (primary) hypertension: Secondary | ICD-10-CM | POA: Insufficient documentation

## 2011-07-12 DIAGNOSIS — M542 Cervicalgia: Secondary | ICD-10-CM | POA: Insufficient documentation

## 2011-07-12 DIAGNOSIS — R209 Unspecified disturbances of skin sensation: Secondary | ICD-10-CM | POA: Insufficient documentation

## 2011-07-12 DIAGNOSIS — E785 Hyperlipidemia, unspecified: Secondary | ICD-10-CM | POA: Insufficient documentation

## 2011-07-12 DIAGNOSIS — M5412 Radiculopathy, cervical region: Secondary | ICD-10-CM | POA: Insufficient documentation

## 2011-07-12 DIAGNOSIS — F17200 Nicotine dependence, unspecified, uncomplicated: Secondary | ICD-10-CM | POA: Insufficient documentation

## 2011-07-12 MED ORDER — HYDROMORPHONE HCL PF 2 MG/ML IJ SOLN
1.0000 mg | Freq: Once | INTRAMUSCULAR | Status: AC
  Administered 2011-07-12: 1 mg via INTRAMUSCULAR
  Filled 2011-07-12: qty 1

## 2011-07-12 MED ORDER — METHOCARBAMOL 500 MG PO TABS: 500.0000 mg | ORAL_TABLET | Freq: Two times a day (BID) | ORAL | Status: AC

## 2011-07-12 NOTE — ED Notes (Signed)
Pt has pinched nerve in her neck that has made her fingers numb for about 2 months. She has an appointment with a doctor on Jan. 3rd.

## 2011-07-12 NOTE — ED Provider Notes (Signed)
History     CSN: [id number]  Arrival date & time 07/12/11  1747   First MD Initiated Contact with Patient 07/12/11 1959     HPI Patient reports neck pain since before thanksgiving. States that Pain is uncontrolled with oxycodone. Reports pain is sharp shooting and associated with weakness. Denies change in symptoms. Reports an appointment on January 3 with the neurosurgeon. Denies incontinence, orchange in ambulation.  Patient is a 77 y.o. female presenting with neck injury. The history is provided by the patient and a relative.  Neck Injury This is a chronic problem. The current episode started more than 1 month ago. The problem occurs constantly. The problem has been gradually worsening. Associated symptoms include neck pain, numbness and weakness. Pertinent negatives include no abdominal pain, chest pain, congestion, fever, headaches, sore throat or vomiting. Exacerbated by: palpation and movement of head. She has tried oral narcotics for the symptoms. The treatment provided no relief.    Past Medical History  Diagnosis Date  . Arthritis   . Depression   . Diabetes mellitus     with neuropathy  . GERD (gastroesophageal reflux disease)   . Seasonal allergies   . HTN (hypertension)   . HLD (hyperlipidemia)   . Lung cancer 2006    lung adenocarcinoma s/p RULobectomy, CT scans Q6 mo  . Kidney carcinoma 2005    left kidney ?TCC s/p nephrectomy, sole right kidney remains  . DDD (degenerative disc disease), cervical   . Diabetic neuropathy     sent to neurologist  . Hepatic steatosis   . Pinched cervical nerve root     Past Surgical History  Procedure Date  . Abdominal hysterectomy 1977    TAH  . Lumbar disc surgery 1950 and 1970s  . Breast excisional biopsy 1970  . Lung cancer surgery 2007    R upper lobectomy, Dr. Edwyna Shell  . Nephrectomy 2004    L removed, Dr. Vernie Ammons  . Spirometry 07/2005    PFTs WNL, good response to bronchodilator  . Cardiac catheterization 10/2004   [place]; nl LV fxn, mod CAD, no focal stenosis  . Lumbar sympathetic block 03/2011    left sided L2-4 for diabetic neuropathy  . US echocardiography 05/2011    EF 55-60%, grade I diastolic dysfunction, mild aortic stenosis and mild mitral regurg  . Mri cervical spine 05/2011    multilevel DDD with thecal sac stenosis and neural foraminal narrowing, concern for bilateral exiting nerve root compression, ?T1 vertebral body edema    Family History  Problem Relation Age of Onset  . Diabetes Mother   . Hyperlipidemia Mother   . Hypertension Mother   . Heart disease Father   . Early death Father 20    CAD/MI  . Hyperlipidemia Father   . Hypertension Father   . Diabetes Sister   . Stroke Sister   . Heart disease Brother   . Cancer Neg Hx   . Diabetes Sister     History  Substance Use Topics  . Smoking status: Current Everyday Smoker -- 0.5 packs/day  . Smokeless tobacco: Not on file   Comment: sometimes more; sometimes less  . Alcohol Use: No    OB History    Grav Para Term Preterm Abortions TAB SAB Ect Mult Living                  Review of Systems  Constitutional: Negative for fever.  HENT: Positive for neck pain. Negative for congestion and sore throat.  Cardiovascular: Negative for chest pain.  Gastrointestinal: Negative for vomiting and abdominal pain.  Musculoskeletal:       Arm pain  Neurological: Positive for weakness and numbness. Negative for headaches.  All other systems reviewed and are negative.    Allergies  Ace inhibitors; Penicillins; and Sulfonamide derivatives  Home Medications   Current Outpatient Rx  Name Route Sig Dispense Refill  . ALBUTEROL SULFATE HFA 108 (90 BASE) MCG/ACT IN AERS Inhalation Inhale 2 puffs into the lungs every 4 (four) hours as needed. For cough,wheezing or shortness of breath     . ALENDRONATE SODIUM 35 MG PO TABS Oral Take 35 mg by mouth every 7 (seven) days. Take with a full glass of water on an empty stomach.  Taken on  Sundays.     . AMLODIPINE BESYLATE 10 MG PO TABS Oral Take 1 tablet (10 mg total) by mouth daily. 90 tablet 3  . ASPIRIN 81 MG PO TABS Oral Take 1 tablet (81 mg total) by mouth every other day.    . ATORVASTATIN CALCIUM 40 MG PO TABS Oral Take 1 tablet (40 mg total) by mouth at bedtime. 90 tablet 3  . CHOLECALCIFEROL 1000 UNITS PO CAPS Oral Take 1 capsule (1,000 Units total) by mouth daily. 30 capsule 11  . GABAPENTIN 300 MG PO CAPS Oral Take 3 capsules (900 mg total) by mouth 3 (three) times daily.    Marland Kitchen METFORMIN HCL 500 MG PO TABS Oral Take 500 mg by mouth 2 (two) times daily with a meal.      . ORPHENADRINE CITRATE 100 MG PO TB12 Oral Take 100 mg by mouth 2 (two) times daily.      . OXYCODONE-ACETAMINOPHEN 5-500 MG PO TABS Oral Take 1 tablet by mouth every 6 (six) hours as needed for pain. 60 tablet 0  . POTASSIUM CHLORIDE CRYS CR 20 MEQ PO TBCR Oral Take 20 mEq by mouth daily.      Marland Kitchen VALSARTAN-HYDROCHLOROTHIAZIDE 320-12.5 MG PO TABS Oral Take 1 tablet by mouth daily. 90 tablet 3  . BLOOD GLUCOSE METER KIT  Freestyle Lite 1 each 0  . GLUCOSE BLOOD VI STRP  Use as instructed 100 each 12  . FREESTYLE LANCETS MISC  Use as instructed 100 each 12    BP 134/61  Pulse 85  Temp(Src) 98.1 F (36.7 C) (Oral)  Resp 18  SpO2 96%  Physical Exam  Vitals reviewed. Constitutional: She is oriented to person, place, and time. Vital signs are normal. She appears well-developed and well-nourished. No distress.  HENT:  Head: Normocephalic and atraumatic.  Eyes: Pupils are equal, round, and reactive to light.  Neck: Neck supple.  Pulmonary/Chest: Effort normal.  Musculoskeletal:       Cervical back: She exhibits decreased range of motion, tenderness, bony tenderness, pain and spasm. She exhibits no swelling, no edema and normal pulse.       Back:  Neurological: She is alert and oriented to person, place, and time.  Skin: Skin is warm and dry. No rash noted. No erythema. No pallor.  Psychiatric: She  has a normal mood and affect. Her behavior is normal.    ED Course  Procedures       MDM   9:36 PM Spoke with MRI technician to inform her an MRI has been ordered. I received significant push back:  She states that MRI Closes at 10 and she is unsure if she can take the patient. Informed her that this patient should have an MRI  tonight.     Patient MRI scheduled for tomorrow as MRI cannot be done tonight. Likely patient has worsened cervical radiculopathy. Requesting another injection of pain medication prior to discharge. Patient has prescription of oxycodone at home. request muscle relaxer in addition. Also advised close followup with neurosurgeon. Patient and family agree with plan and are ready for discharge       Thomasene Lot, [place] 07/13/11 0207

## 2011-07-13 ENCOUNTER — Ambulatory Visit (HOSPITAL_COMMUNITY)
Admit: 2011-07-13 | Discharge: 2011-07-13 | Disposition: A | Discharge status: Home / Self Care | Payer: Medicare Other | Attending: Family Medicine | Admitting: Family Medicine

## 2011-07-13 ENCOUNTER — Telehealth: Payer: Self-pay | Admitting: Internal Medicine

## 2011-07-13 DIAGNOSIS — M5020 Other cervical disc displacement, unspecified cervical region: Secondary | ICD-10-CM | POA: Insufficient documentation

## 2011-07-13 DIAGNOSIS — R29898 Other symptoms and signs involving the musculoskeletal system: Secondary | ICD-10-CM | POA: Insufficient documentation

## 2011-07-13 DIAGNOSIS — D1809 Hemangioma of other sites: Secondary | ICD-10-CM | POA: Insufficient documentation

## 2011-07-13 MED ORDER — HYDROCODONE-ACETAMINOPHEN 5-500 MG PO TABS: 1.0000 | ORAL_TABLET | Freq: Four times a day (QID) | ORAL | Status: AC | PRN

## 2011-07-13 NOTE — Telephone Encounter (Signed)
Uses [facility].  I called pt.  She is continuing to have pain.  Recently with ER eval and MRI pending with neurosurgery eval for early 1/13.  She had nausea with the oxycodone and wanted to know what else could be done.  We talked about options.  Will try to change to hydrocodone/tylenol with sedation caution.  Please call in rx then forward to Dr. Reece Agar for FYI.

## 2011-07-13 NOTE — ED Provider Notes (Signed)
Medical screening examination/treatment/procedure(s) were conducted as a shared visit with non-physician practitioner(s) and myself.  I personally evaluated the patient during the encounter  L cervical radiculopathy pain for >1 month.  Has not seen neurosurgery.  NO headache, vision change.  Pain worsens with movement.  +2 radial pulse.  L grip slightly weaker but questionable effort.  Attempted to obtain MRI tonight.  Refused despite being ordered before 10pm.    Glynn Octave, MD 07/13/11 [phone]

## 2011-07-13 NOTE — Telephone Encounter (Signed)
Medication phoned to pharmacy.  

## 2011-07-13 NOTE — Telephone Encounter (Signed)
Patient called and stated she had to go to the ER because she was in a lot of pain from her neck, left shoulder and left arm and states the hydrocodone that Dr. Maisie Fus  is too strong and is making her nauseated and wanted to know if we could give her something that is not as strong.  Please advise.

## 2011-07-15 NOTE — Telephone Encounter (Signed)
Noted thanks °

## 2011-07-17 DIAGNOSIS — G5600 Carpal tunnel syndrome, unspecified upper limb: Secondary | ICD-10-CM

## 2011-07-17 DIAGNOSIS — M5030 Other cervical disc degeneration, unspecified cervical region: Secondary | ICD-10-CM

## 2011-07-17 HISTORY — DX: Other cervical disc degeneration, unspecified cervical region: M50.30

## 2011-07-17 HISTORY — DX: Carpal tunnel syndrome, unspecified upper limb: G56.00

## 2011-07-19 ENCOUNTER — Other Ambulatory Visit: Payer: Self-pay | Admitting: Internal Medicine

## 2011-07-19 MED ORDER — GABAPENTIN 300 MG PO CAPS: 900.0000 mg | ORAL_CAPSULE | Freq: Three times a day (TID) | ORAL | Status: DC

## 2011-07-19 NOTE — Telephone Encounter (Signed)
Sent in refill. I want her to be taking 900mg  tid.  Please verify.

## 2011-07-19 NOTE — Telephone Encounter (Signed)
Left vm for pt to callback 

## 2011-07-19 NOTE — Telephone Encounter (Signed)
Patient would like a refill on her gabapentin.  Pharmacy told her she couldn't get the refill until next week but she states she needs it for her feet.

## 2011-07-20 NOTE — Telephone Encounter (Signed)
Patient notified as instructed by telephone. Pt said she saw specialist and he called in Gabapentin for her. Pt said if she needed to see Dr Reece Agar she would call us back.

## 2011-07-21 NOTE — Telephone Encounter (Signed)
Noted. Thanks.

## 2011-07-22 ENCOUNTER — Other Ambulatory Visit: Payer: Self-pay | Admitting: Family Medicine

## 2011-07-24 ENCOUNTER — Encounter: Payer: Self-pay | Admitting: Family Medicine

## 2011-07-26 ENCOUNTER — Other Ambulatory Visit: Payer: Self-pay | Admitting: Neurosurgery

## 2011-07-26 DIAGNOSIS — M542 Cervicalgia: Secondary | ICD-10-CM

## 2011-07-26 DIAGNOSIS — M5410 Radiculopathy, site unspecified: Secondary | ICD-10-CM

## 2011-07-31 ENCOUNTER — Ambulatory Visit
Admission: RE | Admit: 2011-07-31 | Discharge: 2011-07-31 | Disposition: A | Discharge status: Home / Self Care | Payer: Medicare Other | Source: Ambulatory Visit | Attending: Neurosurgery | Admitting: Neurosurgery

## 2011-07-31 DIAGNOSIS — M5410 Radiculopathy, site unspecified: Secondary | ICD-10-CM

## 2011-07-31 DIAGNOSIS — M542 Cervicalgia: Secondary | ICD-10-CM

## 2011-07-31 MED ORDER — IOHEXOL 300 MG/ML  SOLN
10.0000 mL | Freq: Once | INTRAMUSCULAR | Status: AC | PRN
  Administered 2011-07-31: 10 mL via INTRATHECAL

## 2011-07-31 MED ORDER — DIAZEPAM 5 MG PO TABS
5.0000 mg | ORAL_TABLET | Freq: Once | ORAL | Status: AC
  Administered 2011-07-31: 5 mg via ORAL

## 2011-07-31 NOTE — Progress Notes (Addendum)
Explained discharge instructions to pt and daughter, consent signed and valium given.  1148 pt returned from myelo, denies pain at present.

## 2011-08-03 ENCOUNTER — Ambulatory Visit: Payer: Medicare Other | Admitting: Family Medicine

## 2011-08-06 ENCOUNTER — Encounter: Payer: Self-pay | Admitting: Family Medicine

## 2011-08-06 ENCOUNTER — Ambulatory Visit (INDEPENDENT_AMBULATORY_CARE_PROVIDER_SITE_OTHER): Payer: Medicare Other | Admitting: Family Medicine

## 2011-08-06 DIAGNOSIS — I2510 Atherosclerotic heart disease of native coronary artery without angina pectoris: Secondary | ICD-10-CM

## 2011-08-06 DIAGNOSIS — E785 Hyperlipidemia, unspecified: Secondary | ICD-10-CM

## 2011-08-06 DIAGNOSIS — Z720 Tobacco use: Secondary | ICD-10-CM

## 2011-08-06 DIAGNOSIS — J438 Other emphysema: Secondary | ICD-10-CM

## 2011-08-06 DIAGNOSIS — E1149 Type 2 diabetes mellitus with other diabetic neurological complication: Secondary | ICD-10-CM

## 2011-08-06 DIAGNOSIS — I10 Essential (primary) hypertension: Secondary | ICD-10-CM

## 2011-08-06 DIAGNOSIS — M5030 Other cervical disc degeneration, unspecified cervical region: Secondary | ICD-10-CM

## 2011-08-06 DIAGNOSIS — F17200 Nicotine dependence, unspecified, uncomplicated: Secondary | ICD-10-CM

## 2011-08-06 LAB — BASIC METABOLIC PANEL
BUN: 11 mg/dL (ref 6–23)
Chloride: 96 mEq/L (ref 96–112)
Creatinine, Ser: 0.8 mg/dL (ref 0.4–1.2)
Glucose, Bld: 119 mg/dL — ABNORMAL HIGH (ref 70–99)

## 2011-08-06 LAB — MICROALBUMIN / CREATININE URINE RATIO
Creatinine,U: 71.5 mg/dL
Microalb Creat Ratio: 2.4 mg/g (ref 0.0–30.0)
Microalb, Ur: 1.7 mg/dL (ref 0.0–1.9)

## 2011-08-06 LAB — LIPID PANEL
Cholesterol: 144 mg/dL (ref 0–200)
HDL: 31 mg/dL — ABNORMAL LOW (ref >39.00)
LDL Cholesterol: 74 mg/dL (ref 0–99)
Total CHOL/HDL Ratio: 5
Triglycerides: 193 mg/dL — ABNORMAL HIGH (ref 0.0–149.0)
VLDL: 38.6 mg/dL (ref 0.0–40.0)

## 2011-08-06 LAB — HEMOGLOBIN A1C: Hgb A1c MFr Bld: 7 % — ABNORMAL HIGH (ref 4.6–6.5)

## 2011-08-06 NOTE — Patient Instructions (Addendum)
Make appointment to see eye doctor. Blood work today, if sugar too high we may talk about changing medicines. Return in 2 months for follow up, sooner if needed. Work on quitting smoking - look into quitline[place].com for more resources.  If you need help, return to talk about this. If you do decide on surgery, you need to quit smoking prior and we may send you to heart doctor to check on heart.

## 2011-08-06 NOTE — Progress Notes (Signed)
Subjective:    Patient ID: Marissa Ho, female    DOB: 08-16-1931, 78 y.o.   MRN: [id number]  HPI CC: 1 mo f/u  78yo with h/o T2Dm, HTN, HLD, low bone density, CAD, h/o RCC s/p nephrectomy and lung cancer s/p lobectomy.  Here for f/u after seeing Dr. Lovell Sheehan, found to have bulging disk x3 at C5/6, 6/7 and C7/T1.  Also found to have CTS.  Recommended back surgery - "3 discs out and sliver plate in".  I have not received f/u NSG office visit.  Feels bedrest and pain medicines (hydrocodone/acetaminophen 10/500) have helped.  Also using ice to neck.  Hadn't used pain medicine in 3 days prior to today.  Worried about having surgery.  "surgery is not what I really want if I can get out of it".    Last surgery under [facility] was 6 yrs ago (lung surgery) and did fine.  Has had several in past.  Smoking - 1+ ppd.  Wants to try to quit cold [place].  Reason given for wanting to quit - cigarettes currently too expensive.  Has tried chantix in past as well as hypnosis.  If needs more help will let me know.  Bone density - no records available of last DEXA.  States done by Dr. Valentina Lucks a few years back.  On alendronate weekly.  And vitamin D.  No recent dexa.  Would like to defer scheduling for now.  [facility] - per dr. Valentina Lucks.  DM - brings log of fasting sugars 90-132.  Compliant with metformin bid. Lab Results  Component Value Date   HGBA1C 7.7* 12/05/2010   Bedrest - has lost 8 lbs.  Attributes this to pain she has had for last several months. Last CPE was 01/2011.  Declines further colonoscopy. Wt Readings from Last 3 Encounters:  08/06/11 143 lb 4 oz (64.978 kg)  07/02/11 151 lb 12 oz (68.833 kg)  05/31/11 151 lb (68.493 kg)   Medications and allergies reviewed and updated in chart.  Past histories reviewed and updated if relevant as below. Patient Active Problem List  Diagnoses  . DIABETES MELLITUS, TYPE II, CONTROLLED, W/NEURO COMPS  . HTN (hypertension)  .  EMPHYSEMA  . Tobacco abuse  . Arthritis  . Depression  . GERD (gastroesophageal reflux disease)  . Seasonal allergies  . HLD (hyperlipidemia)  . Lung cancer, hx  . Kidney carcinoma, hx  . Foot pain  . Healthcare maintenance  . Memory problem  . Orofacial dyskinesia  . Systolic murmur  . DDD (degenerative disc disease), cervical  . Imbalance   Past Medical History  Diagnosis Date  . Arthritis   . Depression   . Diabetes mellitus     with neuropathy  . GERD (gastroesophageal reflux disease)   . Seasonal allergies   . HTN (hypertension)   . HLD (hyperlipidemia)   . Lung cancer 2006    lung adenocarcinoma s/p RULobectomy, CT scans Q6 mo  . Kidney carcinoma 2005    left kidney ?TCC s/p nephrectomy, sole right kidney remains  . DDD (degenerative disc disease), cervical 2012    NSG Dr. Lovell Sheehan, pending CT myelogram and NCV/EMGs  . Diabetic neuropathy     sent to neurologist  . Hepatic steatosis   . Pinched cervical nerve root    Past Surgical History  Procedure Date  . Abdominal hysterectomy 1977    TAH  . Lumbar disc surgery 1950 and 1970s  . Breast excisional biopsy  1970  . Lung cancer surgery 2007    R upper lobectomy, Dr. Edwyna Shell  . Nephrectomy 2004    L removed, Dr. Vernie Ammons  . Spirometry 07/2005    PFTs WNL, good response to bronchodilator  . Cardiac catheterization 10/2004    [place]; nl LV fxn, mod CAD, no focal stenosis  . Lumbar sympathetic block 03/2011    left sided L2-4 for diabetic neuropathy  . US echocardiography 05/2011    EF 55-60%, grade I diastolic dysfunction, mild aortic stenosis and mild mitral regurg  . Mri cervical spine 05/2011    multilevel DDD with thecal sac stenosis and neural foraminal narrowing, concern for bilateral exiting nerve root compression, ?T1 vertebral body edema   History  Substance Use Topics  . Smoking status: Current Everyday Smoker -- 0.5 packs/day  . Smokeless tobacco: Not on file   Comment: sometimes more; sometimes  less  . Alcohol Use: No   Family History  Problem Relation Age of Onset  . Diabetes Mother   . Hyperlipidemia Mother   . Hypertension Mother   . Heart disease Father   . Early death Father 49    CAD/MI  . Hyperlipidemia Father   . Hypertension Father   . Diabetes Sister   . Stroke Sister   . Heart disease Brother   . Cancer Neg Hx   . Diabetes Sister    Allergies  Allergen Reactions  . Ace Inhibitors Cough  . Penicillins Rash    Pt unsure, thinks can take pcn.  . Sulfonamide Derivatives Rash   Current Outpatient Prescriptions on File Prior to Visit  Medication Sig Dispense Refill  . albuterol (VENTOLIN HFA) 108 (90 BASE) MCG/ACT inhaler Inhale 2 puffs into the lungs every 4 (four) hours as needed. For cough,wheezing or shortness of breath       . alendronate (FOSAMAX) 35 MG tablet Take 35 mg by mouth every 7 (seven) days. Take with a full glass of water on an empty stomach.  Taken on Sundays.       Marland Kitchen amLODipine (NORVASC) 10 MG tablet Take 1 tablet (10 mg total) by mouth daily.  90 tablet  3  . aspirin 81 MG tablet Take 1 tablet (81 mg total) by mouth every other day.      Marland Kitchen atorvastatin (LIPITOR) 40 MG tablet Take 1 tablet (40 mg total) by mouth at bedtime.  90 tablet  3  . Blood Glucose Monitoring Suppl (BLOOD GLUCOSE METER) kit Freestyle Lite  1 each  0  . Cholecalciferol 1000 UNITS capsule Take 1 capsule (1,000 Units total) by mouth daily.  30 capsule  11  . gabapentin (NEURONTIN) 300 MG capsule Take 3 capsules (900 mg total) by mouth 3 (three) times daily.  270 capsule  11  . glucose blood test strip Use as instructed  100 each  12  . Lancets (FREESTYLE) lancets Use as instructed  100 each  12  . metFORMIN (GLUCOPHAGE) 500 MG tablet Take 500 mg by mouth 2 (two) times daily with a meal.        . orphenadrine (NORFLEX) 100 MG tablet Take 100 mg by mouth 2 (two) times daily.        . potassium chloride SA (K-DUR,KLOR-CON) 20 MEQ tablet Take 20 mEq by mouth daily.        .  valsartan-hydrochlorothiazide (DIOVAN-HCT) 320-12.5 MG per tablet Take 1 tablet by mouth daily.  90 tablet  3   Review of Systems Per HPI  Objective:   Physical Exam  Nursing note and vitals reviewed. Constitutional: She appears well-developed and well-nourished. No distress.       Not in pain today  HENT:  Head: Normocephalic and atraumatic.  Mouth/Throat: Oropharynx is clear and moist. No oropharyngeal exudate.  Eyes: Conjunctivae and EOM are normal. Pupils are equal, round, and reactive to light. No scleral icterus.  Neck: Normal range of motion. Neck supple.  Cardiovascular: Normal rate, regular rhythm, normal heart sounds and intact distal pulses.   No murmur heard. Pulmonary/Chest: Effort normal and breath sounds normal. No respiratory distress. She has no wheezes. She has no rales.  Musculoskeletal: Normal range of motion. She exhibits no edema.  Lymphadenopathy:    She has no cervical adenopathy.  Skin: Skin is warm and dry. No rash noted.  Psychiatric: She has a normal mood and affect.       Assessment & Plan:

## 2011-08-07 ENCOUNTER — Encounter: Payer: Self-pay | Admitting: Family Medicine

## 2011-08-07 DIAGNOSIS — I2510 Atherosclerotic heart disease of native coronary artery without angina pectoris: Secondary | ICD-10-CM | POA: Insufficient documentation

## 2011-08-07 NOTE — Assessment & Plan Note (Signed)
Encouraged cessation as best thing she can do for her health, pt will work on this on her own, let me know if would like assistance. Provided with online resource.

## 2011-08-07 NOTE — Assessment & Plan Note (Signed)
Chronic, ok control continue regimen. BP Readings from Last 3 Encounters:  08/06/11 132/78  07/31/11 111/62  07/12/11 134/61

## 2011-08-07 NOTE — Assessment & Plan Note (Signed)
Encourage smoking cessation. No recent PFTs, may rec pt undergo spirometry to stage COPD. Only on albuterol and uses rarely.

## 2011-08-07 NOTE — Assessment & Plan Note (Signed)
Stable on metformin 500mg  bid. Check blood work today.

## 2011-08-07 NOTE — Assessment & Plan Note (Signed)
Majority of visit spent discussing this as well as pros/cons of surgery. Pain currently improved, pt wants to defer surgery as much as able, understands risks associated with GETA and cervical surgery (although has had surgeries in past and done well).   This is reasonable option, pursuing surgery if pain becomes intolerable is also reasonable option. If decides on surgery, discussed I would want to further evaluate cardiac and pulmonary status given h/o emphysema and CAD, first step to optimize health is quit smoking.

## 2011-08-11 ENCOUNTER — Encounter: Payer: Self-pay | Admitting: Family Medicine

## 2011-09-10 ENCOUNTER — Other Ambulatory Visit: Payer: Self-pay | Admitting: *Deleted

## 2011-09-10 MED ORDER — HYDROCODONE-ACETAMINOPHEN 10-500 MG PO TABS: 1.0000 | ORAL_TABLET | Freq: Four times a day (QID) | ORAL | Status: DC | PRN

## 2011-09-10 NOTE — Telephone Encounter (Signed)
Rx called in as directed.   

## 2011-09-10 NOTE — Telephone Encounter (Signed)
Ok to refill 

## 2011-09-10 NOTE — Telephone Encounter (Signed)
plz phone in. 

## 2011-09-20 ENCOUNTER — Other Ambulatory Visit: Payer: Self-pay | Admitting: Family Medicine

## 2011-09-21 ENCOUNTER — Other Ambulatory Visit: Payer: Self-pay | Admitting: *Deleted

## 2011-09-21 MED ORDER — HYDROCODONE-ACETAMINOPHEN 10-500 MG PO TABS: 1.0000 | ORAL_TABLET | Freq: Four times a day (QID) | ORAL | Status: DC | PRN

## 2011-09-21 NOTE — Telephone Encounter (Signed)
plz phone in. 

## 2011-09-24 ENCOUNTER — Ambulatory Visit (INDEPENDENT_AMBULATORY_CARE_PROVIDER_SITE_OTHER): Payer: Medicare Other | Admitting: Family Medicine

## 2011-09-24 ENCOUNTER — Encounter: Payer: Self-pay | Admitting: Family Medicine

## 2011-09-24 VITALS — BP 136/76 | HR 88 | Temp 97.7°F | Wt 145.0 lb

## 2011-09-24 DIAGNOSIS — M79673 Pain in unspecified foot: Secondary | ICD-10-CM

## 2011-09-24 DIAGNOSIS — E1149 Type 2 diabetes mellitus with other diabetic neurological complication: Secondary | ICD-10-CM

## 2011-09-24 DIAGNOSIS — E1142 Type 2 diabetes mellitus with diabetic polyneuropathy: Secondary | ICD-10-CM

## 2011-09-24 DIAGNOSIS — M79609 Pain in unspecified limb: Secondary | ICD-10-CM

## 2011-09-24 LAB — BASIC METABOLIC PANEL
CO2: 29 mEq/L (ref 19–32)
Chloride: 94 mEq/L — ABNORMAL LOW (ref 96–112)
Creatinine, Ser: 1 mg/dL (ref 0.4–1.2)
Sodium: 135 mEq/L (ref 135–145)

## 2011-09-24 LAB — URIC ACID: Uric Acid, Serum: 6.9 mg/dL (ref 2.4–7.0)

## 2011-09-24 MED ORDER — HYDROCODONE-ACETAMINOPHEN 10-500 MG PO TABS: 1.0000 | ORAL_TABLET | Freq: Four times a day (QID) | ORAL | Status: DC | PRN

## 2011-09-24 NOTE — Assessment & Plan Note (Signed)
Improved after lumbar sympathetic block, pain now returning. Pt desires re eval with pain management for further treatment options. In meantime, restart vicodin 10/500 for pain prn.  rec minimize mobic (sole R kidney remains after L removed for kidney cancer) Thought diabetic neuropathy (has seen neuro in past) vs complex regional pain. ? Erythromelalgia vs burning foot syndrome as well however did respond to sympathetic block in past.

## 2011-09-24 NOTE — Patient Instructions (Signed)
Pass by Marissa Ho's office for referral to pain management for evaluation of foot pain. Refill of vicodin while we get you in to see pain management.  No more than 1 mobic or meloxicam per day. Blood work today. Keep working on quitting somking!!

## 2011-09-24 NOTE — Progress Notes (Signed)
Subjective:    Patient ID: Marissa Ho, female    DOB: 1932-02-21, 78 y.o.   MRN: [id number]  HPI CC: foot pain  Presents with daughter.  H/o DDD, longstanding foot pain thought due partly due to diabetic periph neuropathy as well as lumbar DDD s/p sympathetic block of lumbar nerve roots 03/2011 by prior pain management (Dr. Metta Clines) with good resolution of pain for 6+ mo.  Prior wondered if erythromelalgia vs burning foot syndrome as hereditary issue (mother and sister with similar pain).  Stopped seeing Dr. Metta Clines 2/2 they didn't feel he listened to them or explained things to them.  Would like re evaluation with different pain management.    Pending move to [facility] she wants to keep PCP.  Foot pain has been returning over last few months, described as burning sore ache throughout bilateral feet.  Hot water helps pain.  Trouble sleeping at night 2/2 pain.  Has been taking pain meds by Dr. Lovell Sheehan (meloxicam 15mg  daily).  Has not returned to see Dr. Lovell Sheehan for cervical DDD and spondylosis because not having shoulder pain anymore.  Doesn't want surgery if she can avoid it.  No fevers/chills, lower back pain or lumbar radiculopathy.  precontemplative re smoking cessation.  Medications and allergies reviewed and updated in chart.  Past histories reviewed and updated if relevant as below. Patient Active Problem List  Diagnoses  . DIABETES MELLITUS, TYPE II, CONTROLLED, W/NEURO COMPS  . HTN (hypertension)  . EMPHYSEMA  . Tobacco abuse  . Arthritis  . Depression  . GERD (gastroesophageal reflux disease)  . Seasonal allergies  . HLD (hyperlipidemia)  . Lung cancer, hx  . Kidney carcinoma, hx  . Foot pain  . Healthcare maintenance  . Memory problem  . Orofacial dyskinesia  . Systolic murmur  . DDD (degenerative disc disease), cervical  . Imbalance  . CAD (coronary artery disease), native coronary artery   Past Medical History  Diagnosis Date  . Arthritis   .  Depression   . Diabetes mellitus     with neuropathy  . GERD (gastroesophageal reflux disease)   . Seasonal allergies   . HTN (hypertension)   . HLD (hyperlipidemia)   . Lung cancer 2006    lung adenocarcinoma s/p RULobectomy, CT scans Q6 mo  . Kidney carcinoma 2005    left kidney ?TCC s/p nephrectomy, sole right kidney remains  . DDD (degenerative disc disease), cervical 07/2011    NSG Dr. Lovell Sheehan, CT myelogram - nerve root cutoff C7-T1, L HNP C5/6, C6/7, C7/T1  . Diabetic neuropathy     sent to neurologist  . Hepatic steatosis   . CAD (coronary artery disease), native coronary artery 2006    cath in [place], mod CAD, no focal obstruction, nl LV fxn  . CTS (carpal tunnel syndrome) 07/2011    On NCV/EMG- L CTS, ulnar neuropathy   Past Surgical History  Procedure Date  . Abdominal hysterectomy 1977    TAH  . Lumbar disc surgery 1950 and 1970s  . Breast excisional biopsy 1970  . Lung cancer surgery 2007    R upper lobectomy, Dr. Edwyna Shell  . Nephrectomy 2004    L removed, Dr. Vernie Ammons  . Spirometry 07/2005    PFTs WNL, good response to bronchodilator  . Cardiac catheterization 10/2004    [place]; nl LV fxn, mod CAD, no focal stenosis  . Lumbar sympathetic block 03/2011    left sided L2-4 for diabetic neuropathy  . US echocardiography 05/2011  EF 55-60%, grade I diastolic dysfunction, mild aortic stenosis and mild mitral regurg  . Mri cervical spine 05/2011    multilevel DDD with thecal sac stenosis and neural foraminal narrowing, concern for bilateral exiting nerve root compression, ?T1 vertebral body edema   History  Substance Use Topics  . Smoking status: Current Everyday Smoker -- 0.5 packs/day  . Smokeless tobacco: Not on file   Comment: sometimes more; sometimes less  . Alcohol Use: No   Family History  Problem Relation Age of Onset  . Diabetes Mother   . Hyperlipidemia Mother   . Hypertension Mother   . Heart disease Father   . Early death Father 30    CAD/MI    . Hyperlipidemia Father   . Hypertension Father   . Diabetes Sister   . Stroke Sister   . Heart disease Brother   . Cancer Neg Hx   . Diabetes Sister    Allergies  Allergen Reactions  . Ace Inhibitors Cough  . Penicillins Rash    Pt unsure, thinks can take pcn.  . Sulfonamide Derivatives Rash   Current Outpatient Prescriptions on File Prior to Visit  Medication Sig Dispense Refill  . albuterol (VENTOLIN HFA) 108 (90 BASE) MCG/ACT inhaler Inhale 2 puffs into the lungs every 4 (four) hours as needed. For cough,wheezing or shortness of breath       . alendronate (FOSAMAX) 35 MG tablet Take 35 mg by mouth every 7 (seven) days. Take with a full glass of water on an empty stomach.  Taken on Sundays.       Marland Kitchen amLODipine (NORVASC) 10 MG tablet TAKE 1 TABLET BY MOUTH ONCE A DAY  90 tablet  3  . aspirin 81 MG tablet Take 1 tablet (81 mg total) by mouth every other day.      Marland Kitchen atorvastatin (LIPITOR) 40 MG tablet Take 1 tablet (40 mg total) by mouth at bedtime.  90 tablet  3  . Blood Glucose Monitoring Suppl (BLOOD GLUCOSE METER) kit Freestyle Lite  1 each  0  . Cholecalciferol 1000 UNITS capsule Take 1 capsule (1,000 Units total) by mouth daily.  30 capsule  11  . gabapentin (NEURONTIN) 300 MG capsule Take 3 capsules (900 mg total) by mouth 3 (three) times daily.  270 capsule  11  . glucose blood test strip Use as instructed  100 each  12  . Lancets (FREESTYLE) lancets Use as instructed  100 each  12  . metFORMIN (GLUCOPHAGE) 500 MG tablet Take 500 mg by mouth 2 (two) times daily with a meal.        . orphenadrine (NORFLEX) 100 MG tablet Take 100 mg by mouth 2 (two) times daily.        . potassium chloride SA (K-DUR,KLOR-CON) 20 MEQ tablet Take 20 mEq by mouth daily.        . valsartan-hydrochlorothiazide (DIOVAN-HCT) 320-12.5 MG per tablet Take 1 tablet by mouth daily.  90 tablet  3     Review of Systems Per HPI    Objective:   Physical Exam  Nursing note and vitals  reviewed. Musculoskeletal: Normal range of motion. She exhibits no edema.       No midline lumbar spine tenderness. No paraspinous mm tenderness  Diabetic foot exam: Erythematous skin distal 1/2 of foot No skin breakdown No calluses  Normal DP/PT pulses bilaterally (2+) Hyperalgesia to light touch and monofilament Large toenails missing bilaterally  Neurological: She is alert.  Reflex Scores:  Patellar reflexes are 2+ on the right side and 2+ on the left side.      Assessment & Plan:

## 2011-09-24 NOTE — Assessment & Plan Note (Signed)
Lab Results  Component Value Date   HGBA1C 7.0* 08/06/2011

## 2011-09-24 NOTE — Telephone Encounter (Signed)
Rx called in as directed.   

## 2011-10-04 ENCOUNTER — Ambulatory Visit: Payer: Medicare Other | Admitting: Family Medicine

## 2011-10-15 HISTORY — PX: OTHER SURGICAL HISTORY: SHX169

## 2011-10-16 ENCOUNTER — Other Ambulatory Visit: Payer: Self-pay

## 2011-10-16 MED ORDER — HYDROCODONE-ACETAMINOPHEN 10-500 MG PO TABS: 1.0000 | ORAL_TABLET | Freq: Four times a day (QID) | ORAL | Status: DC | PRN

## 2011-10-16 NOTE — Telephone Encounter (Signed)
plz phone in vicodin and notify pt sent in.

## 2011-10-16 NOTE — Telephone Encounter (Signed)
Pt said she has 3 disc out in her back;pt putting off surgery because pt is selling her home. Pt also has pain in muscle of lt arm,heat helps some but request muscle relaxant and also refill on Hydrocodone APAP 10-500 mg. [facility] and pt can be reached [phone]. Pt last seen 09/24/11.Please advise.

## 2011-10-16 NOTE — Telephone Encounter (Signed)
No I want [place] to use vicodin as needed, rec against muscle relaxant for now.

## 2011-10-16 NOTE — Telephone Encounter (Signed)
Patient notified. She is strongly considering surgery because she is tired of suffering with pain. She may call for a referral to cards for surgical clearance.

## 2011-10-16 NOTE — Telephone Encounter (Signed)
Rx called in for vicodin. Did you send in a muscle relaxer as requested? I didn't see one listed on her list as being done.

## 2011-10-19 ENCOUNTER — Other Ambulatory Visit: Payer: Self-pay | Admitting: Family Medicine

## 2011-10-22 ENCOUNTER — Ambulatory Visit (INDEPENDENT_AMBULATORY_CARE_PROVIDER_SITE_OTHER): Payer: Medicare Other | Admitting: Family Medicine

## 2011-10-22 ENCOUNTER — Encounter: Payer: Self-pay | Admitting: Family Medicine

## 2011-10-22 ENCOUNTER — Ambulatory Visit (INDEPENDENT_AMBULATORY_CARE_PROVIDER_SITE_OTHER)
Admission: RE | Admit: 2011-10-22 | Discharge: 2011-10-22 | Disposition: A | Discharge status: Home / Self Care | Payer: Medicare Other | Source: Ambulatory Visit | Attending: Family Medicine | Admitting: Family Medicine

## 2011-10-22 VITALS — BP 134/80 | HR 84 | Temp 98.2°F | Wt 145.0 lb

## 2011-10-22 DIAGNOSIS — J438 Other emphysema: Secondary | ICD-10-CM

## 2011-10-22 DIAGNOSIS — Z720 Tobacco use: Secondary | ICD-10-CM

## 2011-10-22 DIAGNOSIS — E1142 Type 2 diabetes mellitus with diabetic polyneuropathy: Secondary | ICD-10-CM

## 2011-10-22 DIAGNOSIS — F17200 Nicotine dependence, unspecified, uncomplicated: Secondary | ICD-10-CM

## 2011-10-22 DIAGNOSIS — M5030 Other cervical disc degeneration, unspecified cervical region: Secondary | ICD-10-CM

## 2011-10-22 DIAGNOSIS — C3490 Malignant neoplasm of unspecified part of unspecified bronchus or lung: Secondary | ICD-10-CM

## 2011-10-22 DIAGNOSIS — E1149 Type 2 diabetes mellitus with other diabetic neurological complication: Secondary | ICD-10-CM

## 2011-10-22 DIAGNOSIS — I2510 Atherosclerotic heart disease of native coronary artery without angina pectoris: Secondary | ICD-10-CM

## 2011-10-22 NOTE — Assessment & Plan Note (Signed)
Given h/o mod CAD by cath pr report, will refer to cards for cardiac clearance.

## 2011-10-22 NOTE — Progress Notes (Signed)
Subjective:    Patient ID: Marissa Ho, female    DOB: [birth year], 78 y.o.   MRN: [id number]  HPI CC: f/u shoulder pain  78yo with h/o T2Dm, HTN, HLD, low bone density, CAD, h/o RCC s/p nephrectomy and lung cancer s/p lobectomy.   Here for f/u after seeing Dr. Lovell Sheehan 07/2011, found to have left HNP x3 at C5/6, 6/7 and C7/T1. Also found to have moderate L CTS and L ulnar neuropathy by NCS/EMG.  Discussed ACDF.  Tired of dealing with left arm pain.  Wants to pursue surgery.  Would like referral to cards and pulm for surgical clearance: CAD - cardiac cath in [place] with normal LV function but mod CAD.  No records available form there.  Not active 2/2 arm pain.  Denies recent chest pain, SOB, leg swelling. COPD - continued smoking - see below.  Denies SOB or wheezing.  No cough.  PFTs WNL 2007.  No recent PFTs/spirometry.  Due to f/u with Dr. Lovell Sheehan to discuss scheduling surgery.  Last surgery was 2007 for lobectomy for lung cancer.  Never any problems with GETA.  Has had ortho surgery in past - lumbar spine.  No problems waking up from anesthesia in past.  H/o L RCC s/p nephrectomy, sole R kidney remains - followed by Dr. Vernie Ammons.  Smoking 1 ppd.  Thinks has done chantix, has done hypnotherapy.  DM - sugars running good.  yesterday fasting sugar 68. Lab Results  Component Value Date   HGBA1C 7.0* 08/06/2011    Wt Readings from Last 3 Encounters:  10/22/11 145 lb (76.000 kg)  09/24/11 145 lb (76.000 kg)  08/06/11 143 lb 4 oz (64.978 kg)   Last CPE was 01/2011. Declines further colonoscopy.  Medications and allergies reviewed and updated in chart.  Past histories reviewed and updated if relevant as below. Patient Active Problem List  Diagnoses  . DIABETES MELLITUS, TYPE II, CONTROLLED, W/NEURO COMPS  . HTN (hypertension)  . EMPHYSEMA  . Tobacco abuse  . Arthritis  . Depression  . GERD (gastroesophageal reflux disease)  . Seasonal allergies  . HLD (hyperlipidemia)   . Lung cancer, hx  . Kidney carcinoma, hx  . Foot pain  . Healthcare maintenance  . Memory problem  . Orofacial dyskinesia  . Systolic murmur  . DDD (degenerative disc disease), cervical  . Imbalance  . CAD (coronary artery disease), native coronary artery   Past Medical History  Diagnosis Date  . Arthritis   . Depression   . Diabetes mellitus     with neuropathy  . GERD (gastroesophageal reflux disease)   . Seasonal allergies   . HTN (hypertension)   . HLD (hyperlipidemia)   . Lung cancer 2006    lung adenocarcinoma s/p RULobectomy, CT scans Q6 mo  . Kidney carcinoma 2005    left kidney ?TCC s/p nephrectomy, sole right kidney remains  . DDD (degenerative disc disease), cervical 07/2011    NSG Dr. Lovell Sheehan, CT myelogram - nerve root cutoff C7-T1, L HNP C5/6, C6/7, C7/T1  . Diabetic neuropathy     sent to neurologist  . Hepatic steatosis   . CAD (coronary artery disease), native coronary artery 2006    cath in [place], mod CAD, no focal obstruction, nl LV fxn  . CTS (carpal tunnel syndrome) 07/2011    On NCV/EMG- L CTS, ulnar neuropathy   Past Surgical History  Procedure Date  . Abdominal hysterectomy 1977    TAH  . Lumbar disc surgery 1950 and  1970s  . Breast excisional biopsy 1970  . Lung cancer surgery 2007    R upper lobectomy, Dr. Edwyna Shell  . Nephrectomy 2004    L removed, Dr. Vernie Ammons  . Spirometry 07/2005    PFTs WNL, good response to bronchodilator  . Cardiac catheterization 10/2004    [place]; nl LV fxn, mod CAD, no focal stenosis  . Lumbar sympathetic block 03/2011    left sided L2-4 for diabetic neuropathy  . US echocardiography 05/2011    EF 55-60%, grade I diastolic dysfunction, mild aortic stenosis and mild mitral regurg  . Mri cervical spine 05/2011    multilevel DDD with thecal sac stenosis and neural foraminal narrowing, concern for bilateral exiting nerve root compression, ?T1 vertebral body edema   History  Substance Use Topics  . Smoking  status: Current Everyday Smoker -- 0.5 packs/day  . Smokeless tobacco: Not on file   Comment: sometimes more; sometimes less  . Alcohol Use: No   Family History  Problem Relation Age of Onset  . Diabetes Mother   . Hyperlipidemia Mother   . Hypertension Mother   . Heart disease Father   . Early death Father 2    CAD/MI  . Hyperlipidemia Father   . Hypertension Father   . Diabetes Sister   . Stroke Sister   . Heart disease Brother   . Cancer Neg Hx   . Diabetes Sister    Allergies  Allergen Reactions  . Ace Inhibitors Cough  . Penicillins Rash    Pt unsure, thinks can take pcn.  . Sulfonamide Derivatives Rash   Current Outpatient Prescriptions on File Prior to Visit  Medication Sig Dispense Refill  . albuterol (VENTOLIN HFA) 108 (90 BASE) MCG/ACT inhaler Inhale 2 puffs into the lungs every 4 (four) hours as needed. For cough,wheezing or shortness of breath       . alendronate (FOSAMAX) 35 MG tablet Take 35 mg by mouth every 7 (seven) days. Take with a full glass of water on an empty stomach.  Taken on Sundays.       Marland Kitchen amLODipine (NORVASC) 10 MG tablet TAKE 1 TABLET BY MOUTH ONCE A DAY  90 tablet  3  . aspirin 81 MG tablet Take 1 tablet (81 mg total) by mouth every other day.      Marland Kitchen atorvastatin (LIPITOR) 40 MG tablet TAKE 1 TABLET BY MOUTH AT BEDTIME  90 tablet  3  . Blood Glucose Monitoring Suppl (BLOOD GLUCOSE METER) kit Freestyle Lite  1 each  0  . Cholecalciferol 1000 UNITS capsule Take 1 capsule (1,000 Units total) by mouth daily.  30 capsule  11  . gabapentin (NEURONTIN) 300 MG capsule Take 3 capsules (900 mg total) by mouth 3 (three) times daily.  270 capsule  11  . glucose blood test strip Use as instructed  100 each  12  . HYDROcodone-acetaminophen (LORTAB) 10-500 MG per tablet Take 1 tablet by mouth every 6 (six) hours as needed.  60 tablet  0  . Lancets (FREESTYLE) lancets Use as instructed  100 each  12  . metFORMIN (GLUCOPHAGE) 500 MG tablet Take 500 mg by mouth  2 (two) times daily with a meal.        . orphenadrine (NORFLEX) 100 MG tablet Take 100 mg by mouth 2 (two) times daily.        . potassium chloride SA (K-DUR,KLOR-CON) 20 MEQ tablet Take 20 mEq by mouth daily.        . valsartan-hydrochlorothiazide (  DIOVAN-HCT) 320-12.5 MG per tablet Take 1 tablet by mouth daily.  90 tablet  3   Review of Systems Per HPI    Objective:   Physical Exam  Nursing note and vitals reviewed. Constitutional: She appears well-developed and well-nourished. No distress.  HENT:  Head: Normocephalic and atraumatic.  Mouth/Throat: Oropharynx is clear and moist. No oropharyngeal exudate.  Neck: Normal range of motion. Neck supple. Carotid bruit is not present.  Cardiovascular: Normal rate, regular rhythm, normal heart sounds and intact distal pulses.   No murmur heard. Pulmonary/Chest: Effort normal and breath sounds normal. No respiratory distress. She has no wheezes. She has no rales.  Musculoskeletal:       Neg spurling bilaterally. FROM at shoulders. No midline spine tenderness.  Lymphadenopathy:    She has no cervical adenopathy.  Skin: Skin is warm and dry. No rash noted.  Psychiatric: She has a normal mood and affect.       Assessment & Plan:

## 2011-10-22 NOTE — Assessment & Plan Note (Signed)
Encouraged continued cessation. Contemplative but pt worried about success.

## 2011-10-22 NOTE — Assessment & Plan Note (Addendum)
Dx by CXR, PFTs 2007 WNL, but good response to bronchodilators. On albuterol, uses rarely. Continues to smoke, will continue to encourage cessation. May refer to pulm to up date PFTs and to see if needs further medical optimization prior to undergoing planned surgery. CXR - nothing acute, hyperinflation  COPD changes.

## 2011-10-22 NOTE — Patient Instructions (Addendum)
Make appointment for follow up with Dr. Lovell Sheehan. Pass by Marion's office to schedule referral to heart doctor. As far as lungs, I will check into some things and call you with who we will refer you to. Chest xray today. The best thing you can do to decrease risk for surgery is quit smoking.

## 2011-10-22 NOTE — Assessment & Plan Note (Addendum)
With HNP C5/6,C6/7,C7/T1.  To schedule f/u appt with NSG to further discuss surgery. Discussed significant risks of surgery given comorbidities, pt understands surgery may not relieve pain. Regardless desires to pursue surgery. Will refer to cards and pulm for further eval.

## 2011-10-23 ENCOUNTER — Ambulatory Visit (INDEPENDENT_AMBULATORY_CARE_PROVIDER_SITE_OTHER): Payer: Medicare Other | Admitting: Cardiovascular Disease

## 2011-10-23 ENCOUNTER — Other Ambulatory Visit: Payer: Self-pay

## 2011-10-23 ENCOUNTER — Encounter: Payer: Self-pay | Admitting: *Deleted

## 2011-10-23 ENCOUNTER — Other Ambulatory Visit: Payer: Self-pay | Admitting: Neurosurgery

## 2011-10-23 ENCOUNTER — Encounter: Payer: Self-pay | Admitting: Cardiovascular Disease

## 2011-10-23 VITALS — BP 118/66 | HR 76 | Ht 62.0 in | Wt 143.4 lb

## 2011-10-23 DIAGNOSIS — I2510 Atherosclerotic heart disease of native coronary artery without angina pectoris: Secondary | ICD-10-CM

## 2011-10-23 DIAGNOSIS — I350 Nonrheumatic aortic (valve) stenosis: Secondary | ICD-10-CM | POA: Insufficient documentation

## 2011-10-23 DIAGNOSIS — R0989 Other specified symptoms and signs involving the circulatory and respiratory systems: Secondary | ICD-10-CM

## 2011-10-23 DIAGNOSIS — R0600 Dyspnea, unspecified: Secondary | ICD-10-CM

## 2011-10-23 DIAGNOSIS — I739 Peripheral vascular disease, unspecified: Secondary | ICD-10-CM

## 2011-10-23 DIAGNOSIS — I359 Nonrheumatic aortic valve disorder, unspecified: Secondary | ICD-10-CM

## 2011-10-23 MED ORDER — ALBUTEROL SULFATE HFA 108 (90 BASE) MCG/ACT IN AERS: 2.0000 | INHALATION_SPRAY | RESPIRATORY_TRACT | Status: AC | PRN

## 2011-10-23 NOTE — Telephone Encounter (Signed)
Sent in

## 2011-10-23 NOTE — Patient Instructions (Signed)
Your physician recommends that you schedule a follow-up appointment in: AFTER TEST HAVE BEEN COMPLETED.  Your physician has requested that you have a lexiscan myoview DX DYSPNEA, THIS WILL BE DONE @ [facility] ON 11/05/11 @ 7:30 AM, YOU WILL NEED TO BE THERE BY 7 AM. For further information please visit [email or web address] Please follow instruction sheet, as given.   Your physician has requested that you have an ankle brachial index (ABI) DX CLAUDICATION. During this test an ultrasound and blood pressure cuff are used to evaluate the arteries that supply the arms and legs with blood. Allow thirty minutes for this exam. There are no restrictions or special instructions.  Your physician has requested that you have a lower extremity arterial duplex DX CLAUDICATION. This test is an ultrasound of the arteries in the legs or arms. It looks at arterial blood flow in the legs and arms. Allow one hour for Lower and Upper Arterial scans. There are no restrictions or special instructions

## 2011-10-23 NOTE — Assessment & Plan Note (Signed)
This was evaluated in November of 2012. The degree of stenosis was mild.

## 2011-10-23 NOTE — Assessment & Plan Note (Addendum)
The patient has known history of moderate coronary artery disease on cardiac catheterization done in 2006. She has significant dyspnea without chest pain and an overall poor functional capacity. Unfortunately, she continues to smoke. She might need to undergo back surgery in the near future. I recommend further evaluation with a pharmacologic nuclear stress test to rule out ischemia given her symptoms of dyspnea. She's not able to exercise on a treadmill due to significant back discomfort and lower extremity neuropathy. In the meantime, I recommend continuing medical treatment with small dose aspirin, atorvastatin and blood pressure control.

## 2011-10-23 NOTE — Assessment & Plan Note (Signed)
She has bilateral foot pain and some of her symptoms seems to be due to peripheral neuropathy. However, she has symptoms suggestive of claudication as well. I could not feel her pedal pulses today. She might have underlying peripheral arterial disease. Thus, I will obtain an ABI arterial Doppler.

## 2011-10-23 NOTE — Progress Notes (Signed)
HPI  This is a 78 year old female who is referred by Dr. Sharen Hones for cardiac evaluation. She reportedly had cardiac catheterization done in 2006 in [place] which showed normal ejection fraction with moderate coronary artery disease. No revascularization was needed. No reported cardiac events since then. She has a known history of a systolic murmur. She had an echocardiogram done last November which showed normal ejection fraction with mild aortic stenosis. She has multiple chronic medical conditions. She was diagnosed with COPD due to extensive smoking history. She has been having significant problems with her back and lower extremity discomfort in her feet thought to be due to neuropathy. There is a possibility of undergoing back surgery in the near future. She denies any chest pain. However, she has significant exertional dyspnea. She had an episode last night of severe dyspnea and almost went to the emergency room. Her symptoms improved after she rested. She denies any dizziness, syncope or presyncope. She does complain of significant discomfort in her lower extremities in both calves and feet. This discomfort happens at rest and with activities. This happens frequently at night also and she is known to have peripheral neuropathy. She is not aware of any previous history of peripheral arterial disease.  Allergies  Allergen Reactions  . Ace Inhibitors Cough  . Penicillins Rash    Pt unsure, thinks can take pcn.  . Sulfonamide Derivatives Rash     Current Outpatient Prescriptions on File Prior to Visit  Medication Sig Dispense Refill  . albuterol (VENTOLIN HFA) 108 (90 BASE) MCG/ACT inhaler Inhale 2 puffs into the lungs every 4 (four) hours as needed. For cough,wheezing or shortness of breath       . alendronate (FOSAMAX) 35 MG tablet Take 35 mg by mouth every 7 (seven) days. Take with a full glass of water on an empty stomach.  Taken on Sundays.       Marland Kitchen amLODipine (NORVASC) 10 MG tablet  TAKE 1 TABLET BY MOUTH ONCE A DAY  90 tablet  3  . aspirin 81 MG tablet Take 1 tablet (81 mg total) by mouth every other day.      Marland Kitchen atorvastatin (LIPITOR) 40 MG tablet TAKE 1 TABLET BY MOUTH AT BEDTIME  90 tablet  3  . Blood Glucose Monitoring Suppl (BLOOD GLUCOSE METER) kit Freestyle Lite  1 each  0  . Cholecalciferol 1000 UNITS capsule Take 1 capsule (1,000 Units total) by mouth daily.  30 capsule  11  . gabapentin (NEURONTIN) 300 MG capsule Take 3 capsules (900 mg total) by mouth 3 (three) times daily.  270 capsule  11  . glucose blood test strip Use as instructed  100 each  12  . HYDROcodone-acetaminophen (LORTAB) 10-500 MG per tablet Take 1 tablet by mouth every 6 (six) hours as needed.  60 tablet  0  . Lancets (FREESTYLE) lancets Use as instructed  100 each  12  . metFORMIN (GLUCOPHAGE) 500 MG tablet Take 500 mg by mouth 2 (two) times daily with a meal.        . orphenadrine (NORFLEX) 100 MG tablet Take 100 mg by mouth 2 (two) times daily.        . potassium chloride SA (K-DUR,KLOR-CON) 20 MEQ tablet Take 20 mEq by mouth daily.        . valsartan-hydrochlorothiazide (DIOVAN-HCT) 320-12.5 MG per tablet Take 1 tablet by mouth daily.  90 tablet  3     Past Medical History  Diagnosis Date  . Arthritis   .  Depression   . Diabetes mellitus     with neuropathy  . GERD (gastroesophageal reflux disease)   . Seasonal allergies   . HTN (hypertension)   . HLD (hyperlipidemia)   . Lung cancer 2006    lung adenocarcinoma s/p RULobectomy, CT scans Q6 mo  . Kidney carcinoma 2005    left kidney ?TCC s/p nephrectomy, sole right kidney remains  . DDD (degenerative disc disease), cervical 07/2011    [facility] Dr. Lovell Sheehan, CT myelogram - nerve root cutoff C7-T1, L HNP C5/6, C6/7, C7/T1  . Diabetic neuropathy     sent to neurologist  . Hepatic steatosis   . CTS (carpal tunnel syndrome) 07/2011    On NCV/EMG- L CTS, ulnar neuropathy  . COPD (chronic obstructive pulmonary disease) 09/26/2010    by CXR,  normal PFTs per report 2007  . CAD (coronary artery disease), native coronary artery 2006    cath in [place], mod CAD, no focal obstruction, nl LV fxn     Past Surgical History  Procedure Date  . Abdominal hysterectomy 1977    TAH  . Lumbar disc surgery 1950 and 1970s  . Breast excisional biopsy 1970  . Lung cancer surgery 2007    R upper lobectomy, Dr. Edwyna Shell  . Nephrectomy 2004    L removed, Dr. Vernie Ammons  . Spirometry 07/2005    PFTs WNL, good response to bronchodilator  . Lumbar sympathetic block 03/2011    left sided L2-4 for diabetic neuropathy  . US echocardiography 05/2011    EF 55-60%, grade I diastolic dysfunction, mild aortic stenosis and mild mitral regurg  . Mri cervical spine 05/2011    multilevel DDD with thecal sac stenosis and neural foraminal narrowing, concern for bilateral exiting nerve root compression, ?T1 vertebral body edema  . Cardiac catheterization 10/2004    [place]; nl LV fxn, mod CAD, no focal stenosis     Family History  Problem Relation Age of Onset  . Diabetes Mother   . Hyperlipidemia Mother   . Hypertension Mother   . Heart disease Father   . Early death Father 6    CAD/MI  . Hyperlipidemia Father   . Hypertension Father   . Diabetes Sister   . Stroke Sister   . Heart disease Brother   . Cancer Neg Hx   . Diabetes Sister      History   Social History  . Marital Status: Widowed    Spouse Name: N/A    Number of Children: 3  . Years of Education: 8th grade   Occupational History  . Finisher for furniture    Social History Main Topics  . Smoking status: Current Everyday Smoker -- 1.0 packs/day for 50 years    Types: Cigarettes  . Smokeless tobacco: Not on file   Comment: sometimes more; sometimes less  . Alcohol Use: No  . Drug Use: No  . Sexually Active: Not on file   Other Topics Concern  . Not on file   Social History Narrative   Lives alone.  Dog at home.Some friends in area, no family. Goes to church.Children  live in [place], [place]     ROS Constitutional: Negative for fever, chills, diaphoresis, activity change.  HENT: Negative for hearing loss, nosebleeds, congestion, sore throat, facial swelling, drooling, trouble swallowing, neck pain, voice change, sinus pressure and tinnitus.  Eyes: Negative for photophobia, pain, discharge and visual disturbance.  Respiratory: Negative for apnea, cough, chest tightness. Cardiovascular: Negative for chest pain, palpitations.  Gastrointestinal: Negative  for nausea, vomiting, abdominal pain, diarrhea, constipation, blood in stool and abdominal distention.  Genitourinary: Negative for dysuria, urgency, frequency, hematuria and decreased urine volume.  Musculoskeletal: Negative for myalgias, joint swelling. Skin: Negative for color change, pallor, rash and wound.  Neurological: Negative for dizziness, tremors, seizures, syncope, speech difficulty, weakness, light-headedness and headaches.  Psychiatric/Behavioral: Negative for suicidal ideas, hallucinations, behavioral problems and agitation. The patient is not nervous/anxious.     PHYSICAL EXAM   BP 118/66  Pulse 76  Ht 5\' 2"  (1.575 m)  Wt 143 lb 6.4 oz (65.046 kg)  BMI 26.23 kg/m2  Constitutional: She is oriented to person, place, and time. She appears well-developed and well-nourished. No distress.  HENT: No nasal discharge.  Head: Normocephalic and atraumatic.  Eyes: Pupils are equal and round. Right eye exhibits no discharge. Left eye exhibits no discharge.  Neck: Normal range of motion. Neck supple. No JVD present. No thyromegaly present.  Cardiovascular: Normal rate, regular rhythm, normal heart sounds. Exam reveals no gallop and no friction rub. There is a 2/6 systolic ejection murmur at the aortic area which is early peaking. DP/PT pulses are not palpable bilaterally. Pulmonary/Chest: Effort normal and breath sounds normal. No stridor. No respiratory distress. She has no wheezes. She has no rales.  She exhibits no tenderness.  Abdominal: Soft. Bowel sounds are normal. She exhibits no distension. There is no tenderness. There is no rebound and no guarding.  Musculoskeletal: Normal range of motion. She exhibits trace edema and no tenderness.  Neurological: She is alert and oriented to person, place, and time. Coordination normal.  Skin: Skin is warm and dry. Mild stasis dermatitis in the lower extremities. She is not diaphoretic. No erythema. No pallor.  Psychiatric: She has a normal mood and affect. Her behavior is normal. Judgment and thought content normal.    EKG: Normal sinus rhythm with poor R-wave progression in the anterior leads. Nonspecific T wave changes in the precordial leads.   ASSESSMENT AND PLAN

## 2011-10-23 NOTE — Telephone Encounter (Signed)
Pt dropped off request for refill Ventolin HFA AER inhaler with instructions inhale two puffs every 4 hrs as needed. Pt last seen 10/22/11. Pt uses [facility]. Please advise. Pt can be reached [phone].Please advise.

## 2011-10-26 ENCOUNTER — Other Ambulatory Visit: Payer: Self-pay | Admitting: Family Medicine

## 2011-10-29 ENCOUNTER — Telehealth: Payer: Self-pay

## 2011-10-29 NOTE — Telephone Encounter (Signed)
Marissa Ho with pain clinic at Dr Metta Clines office left v/m asking if pt could stop ASA for 5 days for procedure to be done by Dr Metta Clines.Marissa Ho can be reached at [phone]. **  Pt also called and said Dr Metta Clines will not give her any med for pain. Pt said the hydrocodone is not helping pain. Pt said Mon 11/05/11 pt to have injection in back for severe foot pain. Pt wants Dr Sharen Hones to prescribe pain med. I asked pt if she would talk with Dr Metta Clines since he is in pain management and she said she has and he won't give her anything that will help the pain. Pt said she uses [facility] as her pharmacy and can be reached at [phone].

## 2011-10-29 NOTE — Telephone Encounter (Signed)
Ok to stop ASA for 5 days prior to procedure. Can we get Dr. Letta Moynahan number so I can touch base with him tomorrow re pain meds for pt?  Thanks.

## 2011-10-30 MED ORDER — OXYCODONE-ACETAMINOPHEN 5-325 MG PO TABS: 1.0000 | ORAL_TABLET | Freq: Three times a day (TID) | ORAL | Status: DC | PRN

## 2011-10-30 NOTE — Telephone Encounter (Signed)
Deana at Dr. Letta Moynahan office notified. His number is [phone].

## 2011-10-30 NOTE — Telephone Encounter (Signed)
Called, left message at Dr. Letta Moynahan office.  I didn't know pt was seeing Dr. Metta Clines again.  I want to touch base to see who will be prescribing narcotics as according to pt Dr. Metta Clines not prescribing, wanted to verify this. Selena Batten can we try and touch base with RN regarding above?

## 2011-10-30 NOTE — Telephone Encounter (Signed)
Spoke with Dr. Metta Clines. Pt currently not under pain contract with Dr. Metta Clines. Per Dr. Letta Moynahan office, pt had endorsed leg swelling, redness and concern for infection or other so wanted eval by PCP prior to seeing. Please call Ms Encalade to get more information on leg pain.  If no concern for infection, DVT ,etc, recommend schedule appt with Dr. Metta Clines for evaluation.  O/w come in to see me.

## 2011-10-31 NOTE — Telephone Encounter (Signed)
Noted. Thanks.

## 2011-10-31 NOTE — Telephone Encounter (Signed)
Spoke with patient. She said she never mentioned anything about her leg. She was telling them about her feet being swollen and painful. She said she could prop them up and the swelling would go away, but the pain is still present. She did say she saw Dr. Lovell Sheehan ([facility]) yesterday. He wants her to try PT as a surgical alternative and then he may try steroid injections if PT doesn't help. He put her on some pain medicine (she couldn't remember the name off hand) which helps but said she didn't sign a pain contract with him. She is going to have her procedure with Dr. Metta Clines next week and hopefully she will be feeling better.

## 2011-11-01 ENCOUNTER — Encounter (INDEPENDENT_AMBULATORY_CARE_PROVIDER_SITE_OTHER): Payer: Medicare Other

## 2011-11-01 DIAGNOSIS — I739 Peripheral vascular disease, unspecified: Secondary | ICD-10-CM

## 2011-11-05 ENCOUNTER — Ambulatory Visit: Payer: Self-pay | Admitting: Pain Medicine

## 2011-11-07 ENCOUNTER — Telehealth: Payer: Self-pay

## 2011-11-07 ENCOUNTER — Other Ambulatory Visit: Payer: Self-pay

## 2011-11-07 DIAGNOSIS — R0600 Dyspnea, unspecified: Secondary | ICD-10-CM

## 2011-11-07 NOTE — Telephone Encounter (Signed)
Notified patient need to reschedule Steffanie Dunn that she missed at [facility] on November 06, 2011. The patient would like to wait until Nov 27, 2011 to have test due to having an injection in her foot. We will call the patient with the appointment date and time.  Spoke with Darel Hong at [facility] for [facility]

## 2011-11-07 NOTE — Telephone Encounter (Signed)
The patient will have the Lexiscan on Nov 26, 2011 at [facility] that was rescheduled from November 06, 2011.

## 2011-11-08 ENCOUNTER — Encounter: Payer: Self-pay | Admitting: Family Medicine

## 2011-11-12 ENCOUNTER — Inpatient Hospital Stay (HOSPITAL_COMMUNITY): Admission: RE | Admit: 2011-11-12 | Payer: Medicare Other | Source: Ambulatory Visit | Admitting: Neurosurgery

## 2011-11-12 ENCOUNTER — Ambulatory Visit: Payer: Self-pay | Admitting: Pain Medicine

## 2011-11-12 ENCOUNTER — Ambulatory Visit: Payer: Medicare Other | Admitting: Cardiovascular Disease

## 2011-11-12 ENCOUNTER — Encounter (HOSPITAL_COMMUNITY): Admission: RE | Payer: Self-pay | Source: Ambulatory Visit

## 2011-11-12 SURGERY — ANTERIOR CERVICAL DECOMPRESSION/DISCECTOMY FUSION 3 LEVELS
Anesthesia: General

## 2011-11-16 ENCOUNTER — Encounter: Payer: Self-pay | Admitting: Family Medicine

## 2011-11-26 ENCOUNTER — Ambulatory Visit: Payer: Self-pay | Admitting: Cardiovascular Disease

## 2011-11-26 DIAGNOSIS — R0602 Shortness of breath: Secondary | ICD-10-CM

## 2011-11-27 ENCOUNTER — Ambulatory Visit: Payer: Self-pay | Admitting: Pain Medicine

## 2011-11-29 ENCOUNTER — Telehealth: Payer: Self-pay | Admitting: Family Medicine

## 2011-11-29 NOTE — Telephone Encounter (Signed)
plz notify Dr. Letta Moynahan nurse that I agree for Dr. Metta Clines to prescribe pain meds from here on out of he's willing.  (received his latest consult note)

## 2011-11-29 NOTE — Telephone Encounter (Signed)
Message left with Tresa Endo advising Dr. Letta Moynahan office to take over prescribing pain meds.

## 2011-12-03 ENCOUNTER — Telehealth: Payer: Self-pay | Admitting: *Deleted

## 2011-12-03 ENCOUNTER — Ambulatory Visit (INDEPENDENT_AMBULATORY_CARE_PROVIDER_SITE_OTHER): Payer: Medicare Other | Admitting: Family Medicine

## 2011-12-03 ENCOUNTER — Encounter: Payer: Self-pay | Admitting: Family Medicine

## 2011-12-03 VITALS — BP 136/84 | HR 88 | Temp 98.7°F | Wt 145.0 lb

## 2011-12-03 DIAGNOSIS — E1149 Type 2 diabetes mellitus with other diabetic neurological complication: Secondary | ICD-10-CM

## 2011-12-03 DIAGNOSIS — E1142 Type 2 diabetes mellitus with diabetic polyneuropathy: Secondary | ICD-10-CM

## 2011-12-03 DIAGNOSIS — G8929 Other chronic pain: Secondary | ICD-10-CM | POA: Insufficient documentation

## 2011-12-03 DIAGNOSIS — M79609 Pain in unspecified limb: Secondary | ICD-10-CM

## 2011-12-03 DIAGNOSIS — M79673 Pain in unspecified foot: Secondary | ICD-10-CM

## 2011-12-03 MED ORDER — KETOROLAC TROMETHAMINE 60 MG/2ML IM SOLN
30.0000 mg | Freq: Once | INTRAMUSCULAR | Status: AC
  Administered 2011-12-03: 30 mg via INTRAMUSCULAR

## 2011-12-03 MED ORDER — AMITRIPTYLINE HCL 50 MG PO TABS: 50.0000 mg | ORAL_TABLET | Freq: Every day | ORAL | Status: DC

## 2011-12-03 NOTE — Telephone Encounter (Signed)
PA form filled out and placed in Kim's box. Consider switch to nortryptyline

## 2011-12-03 NOTE — Patient Instructions (Signed)
Keep July appointment. Return in 3 months for medicare wellness visit toradol shot today. Start amitriptyline at 25mg  nightly , may go up to 50mg  nightly after 1 week. Good to see you today, call us with questions.

## 2011-12-03 NOTE — Telephone Encounter (Signed)
spoke to Ms Pincock. She was upset about how she felt she was treated at Dr. Letta Moynahan office, does not want to return there. Wants referral to pain management in [place].  She has requested referral by Dr. Lovell Sheehan there, will await appt. Picked up amitriptyline to try tonight.

## 2011-12-03 NOTE — Telephone Encounter (Addendum)
Spoke with daughter re concerns with Dr. Letta Moynahan office. [facility] - amit[facility]yline needs prior auth - but cheap so pharmacist will place on discount card to provide today.  Will send me PA form regardless.  [facility] to notify ready for pick up.

## 2011-12-03 NOTE — Telephone Encounter (Signed)
PA paperwork in your IN box

## 2011-12-03 NOTE — Progress Notes (Signed)
  Subjective:    Patient ID: Marissa Ho, female    DOB: 1932-01-28, 78 y.o.   MRN: [id number]  HPI CC: foot pain  Known lumbar DDD s/p lumbar disc surgery x2.  Also with significant diabetic neuropathy component to pain given tingling, burning and numbness present.  Followed by Dr Metta Clines for this.  Last week underwent lumbar sympathetic dystrophy.  Pending his picking up prescription of narcotics (currently prescribed hydrocodone by neurosurgery)  Presents today with worsening foot pain.  Up all night with bilateral foot pain. No results found for this basename: URICACID  Mainly toes and bottoms of feet.  Even pain with touching sheet with foot.  Propping legs up at night helps pain. Denies nausea.  Requests shot of toradol today for pain.  Worsening SOB since yesterday.  No cough.  Smoking 1 ppd.  Using albuterol more regularly.  Next apt with Dr Metta Clines is 12/12/2011.  To see heart doctor tomorrow.  Had stress test done last week.  States ABIs normal.  Past Medical History  Diagnosis Date  . Arthritis   . Depression   . Diabetes mellitus     with neuropathy  . GERD (gastroesophageal reflux disease)   . Seasonal allergies   . HTN (hypertension)   . HLD (hyperlipidemia)   . Lung cancer 2006    lung adenocarcinoma s/p RULobectomy, CT scans Q6 mo  . Kidney carcinoma 2005    left kidney ?TCC s/p nephrectomy, sole right kidney remains  . DDD (degenerative disc disease), cervical 07/2011    [facility] Dr. Lovell Sheehan, CT myelogram - nerve root cutoff C7-T1, L HNP C5/6, C6/7, C7/T1  . Diabetic neuropathy     sent to neurologist  . Hepatic steatosis   . CTS (carpal tunnel syndrome) 07/2011    On NCV/EMG- L CTS, ulnar neuropathy  . COPD (chronic obstructive pulmonary disease) 09/26/2010    by CXR, normal PFTs per report 2007  . CAD (coronary artery disease), native coronary artery 2006    cath in [place], mod CAD, no focal obstruction, nl LV fxn  . Aortic valve stenosis, mild     Review  of Systems Per HPI    Objective:   Physical Exam  Nursing note and vitals reviewed. Constitutional: She appears well-developed and well-nourished. No distress.  HENT:  Head: Normocephalic and atraumatic.  Mouth/Throat: Oropharynx is clear and moist. No oropharyngeal exudate.  Eyes: Conjunctivae and EOM are normal. Pupils are equal, round, and reactive to light. No scleral icterus.  Neck: Normal range of motion. Neck supple.  Cardiovascular: Normal rate, regular rhythm and intact distal pulses.   Murmur (mild SEM) heard. Pulmonary/Chest: Effort normal and breath sounds normal. No respiratory distress. She has no wheezes. She has no rales.       Coarse breath sounds  Musculoskeletal: She exhibits no edema.       Tender to touch midfoot distallly. Erythematous and swollen toes. Big toenails absent bilaterally  Lymphadenopathy:    She has no cervical adenopathy.      Assessment & Plan:

## 2011-12-03 NOTE — Telephone Encounter (Signed)
Pt's daughter wants to speak with you re a conversation pt had with Dr Letta Moynahan office after seeing you.

## 2011-12-04 ENCOUNTER — Encounter: Payer: Self-pay | Admitting: Cardiovascular Disease

## 2011-12-04 ENCOUNTER — Ambulatory Visit (INDEPENDENT_AMBULATORY_CARE_PROVIDER_SITE_OTHER): Payer: Medicare Other | Admitting: Cardiovascular Disease

## 2011-12-04 ENCOUNTER — Other Ambulatory Visit: Payer: Medicare Other

## 2011-12-04 VITALS — BP 152/73 | HR 76 | Ht 63.0 in | Wt 145.0 lb

## 2011-12-04 DIAGNOSIS — E785 Hyperlipidemia, unspecified: Secondary | ICD-10-CM

## 2011-12-04 DIAGNOSIS — I359 Nonrheumatic aortic valve disorder, unspecified: Secondary | ICD-10-CM

## 2011-12-04 DIAGNOSIS — I2510 Atherosclerotic heart disease of native coronary artery without angina pectoris: Secondary | ICD-10-CM

## 2011-12-04 DIAGNOSIS — M79673 Pain in unspecified foot: Secondary | ICD-10-CM

## 2011-12-04 DIAGNOSIS — M79609 Pain in unspecified limb: Secondary | ICD-10-CM

## 2011-12-04 DIAGNOSIS — I350 Nonrheumatic aortic (valve) stenosis: Secondary | ICD-10-CM

## 2011-12-04 NOTE — Assessment & Plan Note (Signed)
Recent nuclear stress test showed no evidence of ischemia with normal ejection fraction. I recommend continuing aggressive medical therapy.

## 2011-12-04 NOTE — Assessment & Plan Note (Signed)
There is evidence of mild aortic stenosis. I will monitor this with echocardiograms every 1- 2 years. I will have her followup with me in one year from now.

## 2011-12-04 NOTE — Assessment & Plan Note (Signed)
Had ABI was normal. It appears that most of her symptoms are related to neuropathy.

## 2011-12-04 NOTE — Patient Instructions (Signed)
Your stress test was normal.  You have mild aortic valve stenosis that will need to be monitored.  Follow up in 1 year.

## 2011-12-04 NOTE — Assessment & Plan Note (Signed)
Due to moderate nonobstructive coronary artery disease, I recommend a target LDL of less than 100. Lab Results  Component Value Date   CHOL 144 08/06/2011   HDL 31.00* 08/06/2011   LDLCALC 74 08/06/2011   LDLDIRECT 93.5 01/30/2011   TRIG 193.0* 08/06/2011   CHOLHDL 5 08/06/2011   She does have low HDL and elevated triglycerides. I discussed with her the importance of lifestyle changes as well as smoking cessation.

## 2011-12-04 NOTE — Progress Notes (Signed)
HPI  This is a 77 year old female who is here today for a followup visit.  She reportedly had cardiac catheterization done in 2006 in [place] which showed normal ejection fraction with moderate coronary artery disease. No revascularization was needed. No reported cardiac events since then. She has a known history of a systolic murmur due to mild aortic stenosis. She has multiple chronic medical conditions. She was diagnosed with COPD due to extensive smoking history. She has been having significant problems with her back and lower extremity discomfort in her feet thought to be due to neuropathy. There is a possibility of undergoing back surgery in the near future. She denies any chest pain. However, she has significant exertional dyspnea. She had an episode last night of severe dyspnea and almost went to the emergency room. Her symptoms improved after she rested. She denies any dizziness, syncope or presyncope.  She does complain of significant discomfort in her lower extremities in both calves and feet. This discomfort happens at rest and with activities. This happens frequently at night also and she is known to have peripheral neuropathy.  She underwent evaluation with a pharmacologic nuclear stress test which showed no evidence of ischemia with normal ejection fraction. ABI was also normal with no evidence of PAD.  Allergies  Allergen Reactions  . Ace Inhibitors Cough  . Penicillins Rash    Pt unsure, thinks can take pcn.  . Sulfonamide Derivatives Rash     Current Outpatient Prescriptions on File Prior to Visit  Medication Sig Dispense Refill  . albuterol (VENTOLIN HFA) 108 (90 BASE) MCG/ACT inhaler Inhale 2 puffs into the lungs every 4 (four) hours as needed for wheezing or shortness of breath.  1 Inhaler  11  . alendronate (FOSAMAX) 35 MG tablet Take 35 mg by mouth every 7 (seven) days. Take with a full glass of water on an empty stomach.  Taken on Sundays.       Marland Kitchen amitriptyline  (ELAVIL) 50 MG tablet Take 1 tablet (50 mg total) by mouth at bedtime. (1/2 tab for 1 wk then may increase to 1 tab)  30 tablet  3  . amLODipine (NORVASC) 10 MG tablet TAKE 1 TABLET BY MOUTH ONCE A DAY  90 tablet  3  . aspirin 81 MG tablet Take 1 tablet (81 mg total) by mouth every other day.      Marland Kitchen atorvastatin (LIPITOR) 40 MG tablet TAKE 1 TABLET BY MOUTH AT BEDTIME  90 tablet  3  . Blood Glucose Monitoring Suppl (BLOOD GLUCOSE METER) kit Freestyle Lite  1 each  0  . [facility]S VITAMIN D3 1000 UNITS capsule TAKE 1 CAPSULE BY MOUTH ONCE A DAY  30 capsule  10  . gabapentin (NEURONTIN) 300 MG capsule Take 3 capsules (900 mg total) by mouth 3 (three) times daily.  270 capsule  11  . HYDROcodone-acetaminophen (LORTAB) 10-500 MG per tablet Take 1 tablet by mouth every 6 (six) hours as needed.  60 tablet  0  . Lancets (FREESTYLE) lancets Use as instructed  100 each  12  . metFORMIN (GLUCOPHAGE) 500 MG tablet Take 500 mg by mouth 2 (two) times daily with a meal.        . orphenadrine (NORFLEX) 100 MG tablet Take 100 mg by mouth 2 (two) times daily.        . potassium chloride SA (K-DUR,KLOR-CON) 20 MEQ tablet Take 20 mEq by mouth daily.        . valsartan-hydrochlorothiazide (DIOVAN-HCT) 320-12.5 MG  per tablet Take 1 tablet by mouth daily.  90 tablet  3   Current Facility-Administered Medications on File Prior to Visit  Medication Dose Route Frequency Provider Last Rate Last Dose  . ketorolac (TORADOL) injection 30 mg  30 mg Intramuscular Once Eustaquio Boyden, MD   30 mg at 12/03/11 1008     Past Medical History  Diagnosis Date  . Arthritis     cervical, lumbar  . Depression   . Diabetes mellitus with neuropathy     seen neurologist in past  . GERD (gastroesophageal reflux disease)   . Seasonal allergies   . HTN (hypertension)   . HLD (hyperlipidemia)   . Lung cancer 2006    lung adenocarcinoma s/p RULobectomy, CT scans Q6 mo  . Kidney carcinoma 2005    left kidney ?TCC s/p nephrectomy, sole  right kidney remains  . DDD (degenerative disc disease), cervical 07/2011    NSG Dr. Lovell Sheehan, CT myelogram - nerve root cutoff C7-T1, L HNP C5/6, C6/7, C7/T1  . Hepatic steatosis   . CTS (carpal tunnel syndrome) 07/2011    On NCV/EMG- L CTS, ulnar neuropathy  . COPD (chronic obstructive pulmonary disease) 09/26/2010    by CXR, normal PFTs per report 2007  . CAD (coronary artery disease), native coronary artery 2006    cath in [place], mod CAD, no focal obstruction, nl LV fxn  . Aortic valve stenosis, mild      Past Surgical History  Procedure Date  . Abdominal hysterectomy 1977    TAH  . Lumbar disc surgery 1950 and 1970s  . Breast excisional biopsy 1970  . Lung cancer surgery 2007    R upper lobectomy, Dr. Edwyna Shell  . Nephrectomy 2004    L removed, Dr. Vernie Ammons  . Spirometry 07/2005    PFTs WNL, good response to bronchodilator  . Lumbar sympathetic block 03/2011    left sided L2-4 for diabetic neuropathy  . US echocardiography 05/2011    EF 55-60%, grade I diastolic dysfunction, mild aortic stenosis and mild mitral regurg  . Mri cervical spine 05/2011    multilevel DDD with thecal sac stenosis and neural foraminal narrowing, concern for bilateral exiting nerve root compression, ?T1 vertebral body edema  . Cardiac catheterization 10/2004    [place]; nl LV fxn, mod CAD, no focal stenosis  . Abis 10/2011    normal  . Lumbar sympathetic block 10/2011    L2-4     Family History  Problem Relation Age of Onset  . Diabetes Mother   . Hyperlipidemia Mother   . Hypertension Mother   . Heart disease Father   . Early death Father 82    CAD/MI  . Hyperlipidemia Father   . Hypertension Father   . Diabetes Sister   . Stroke Sister   . Heart disease Brother   . Cancer Neg Hx   . Diabetes Sister      History   Social History  . Marital Status: Widowed    Spouse Name: N/A    Number of Children: 3  . Years of Education: 8th grade   Occupational History  . Finisher for  furniture    Social History Main Topics  . Smoking status: Current Everyday Smoker -- 1.0 packs/day for 50 years    Types: Cigarettes  . Smokeless tobacco: Not on file   Comment: sometimes more; sometimes less  . Alcohol Use: No  . Drug Use: No  . Sexually Active: Not on file   Other Topics  Concern  . Not on file   Social History Narrative   Lives alone.  Dog at home.Some friends in area, no family. Goes to church.Children live in [place], [place]     PHYSICAL EXAM   BP 152/73  Pulse 76  Ht 5\' 3"  (1.6 m)  Wt 145 lb (65.772 kg)  BMI 25.69 kg/m2  Constitutional: She is oriented to person, place, and time. She appears well-developed and well-nourished. No distress.  HENT: No nasal discharge.  Head: Normocephalic and atraumatic.  Eyes: Pupils are equal and round. Right eye exhibits no discharge. Left eye exhibits no discharge.  Neck: Normal range of motion. Neck supple. No JVD present. No thyromegaly present.  Cardiovascular: Normal rate, regular rhythm, normal heart sounds. Exam reveals no gallop and no friction rub. There is a 2/6 systolic ejection murmur at the aortic area which is early peaking.  Pulmonary/Chest: Effort normal and diminished breath sounds. No stridor. No respiratory distress. She has no wheezes. She has no rales. She exhibits no tenderness.  Abdominal: Soft. Bowel sounds are normal. She exhibits no distension. There is no tenderness. There is no rebound and no guarding.  Musculoskeletal: Normal range of motion. She exhibits no edema and no tenderness.  Neurological: She is alert and oriented to person, place, and time. Coordination normal.  Skin: Skin is warm and dry. No rash noted. She is not diaphoretic. No erythema. No pallor.  Psychiatric: She has a normal mood and affect. Her behavior is normal. Judgment and thought content normal.      ASSESSMENT AND PLAN

## 2011-12-04 NOTE — Assessment & Plan Note (Signed)
TSH, B12 normal.  Denies EtOH.  ABI normal. Thought diabetic neuropathy (has seen neuro in past) vs complex regional pain syndrome.  ? Erythromelalgia vs burning foot syndrome as well given familial nature. Recent lumbar sympathetic block, mild effect. Discussed rec continued f/u with pain management. Add on amitriptyline to see if better control pain.  Consider change to nortriptyline vs trial of cymbalta. Toradol shot today. Never checked uric acid - check today

## 2011-12-05 ENCOUNTER — Telehealth: Payer: Self-pay | Admitting: *Deleted

## 2011-12-05 LAB — URIC ACID: Uric Acid, Serum: 6.8 mg/dL (ref 2.4–7.0)

## 2011-12-05 NOTE — Telephone Encounter (Signed)
Patient was asking about some pain meds. She said she refused to go back to Dr. Metta Clines due to issues with him and his staff. She was in need of some medicine to help with the pain she is having.

## 2011-12-06 ENCOUNTER — Telehealth: Payer: Self-pay

## 2011-12-06 MED ORDER — HYDROCODONE-ACETAMINOPHEN 10-500 MG PO TABS: 1.0000 | ORAL_TABLET | Freq: Four times a day (QID) | ORAL | Status: DC | PRN

## 2011-12-06 NOTE — Telephone Encounter (Signed)
plz phone in. 

## 2011-12-06 NOTE — Telephone Encounter (Signed)
Rx called in as directed.   

## 2011-12-06 NOTE — Telephone Encounter (Signed)
[facility] Amitriptyline 50 mg. PA form received, completed and faxed.approval letter received; faxed to [facility] PA form and letter placed on drs desk for signature and scanning.

## 2011-12-12 ENCOUNTER — Telehealth: Payer: Self-pay

## 2011-12-12 NOTE — Telephone Encounter (Signed)
Marissa Ho with Dr Metta Clines pain management clinic will fax consent form for Dr Reece Agar to give permission for Dr Metta Clines to prescribe pain med. [facility] request call back when get form.

## 2011-12-13 NOTE — Telephone Encounter (Signed)
Spoke with patient and confirmed she is no longer going to see Dr. Metta Clines. She is still awaiting referral from Dr. Lovell Sheehan to a new pain management center. Victorino Dike at Dr. Letta Moynahan office notified.

## 2011-12-13 NOTE — Telephone Encounter (Signed)
i've filled this out several times, and I believe last visit we called and spoke with his nurse verifying ok to start (and faxed form). However, I was under understanding that Ms Yzaguirre was going to a new pain management center.  Can we call and verify this with Ms Postel and if so then let Dr. Letta Moynahan office know.

## 2011-12-14 ENCOUNTER — Other Ambulatory Visit: Payer: Self-pay | Admitting: Cardiovascular Disease

## 2011-12-14 DIAGNOSIS — R0600 Dyspnea, unspecified: Secondary | ICD-10-CM

## 2011-12-20 ENCOUNTER — Other Ambulatory Visit: Payer: Self-pay

## 2011-12-20 NOTE — Telephone Encounter (Signed)
[facility] refill Lortab.Dr Reece Agar out of office. Please advise.

## 2011-12-21 ENCOUNTER — Other Ambulatory Visit: Payer: Self-pay

## 2011-12-21 MED ORDER — HYDROCODONE-ACETAMINOPHEN 10-500 MG PO TABS: 1.0000 | ORAL_TABLET | Freq: Four times a day (QID) | ORAL | Status: DC | PRN

## 2011-12-21 NOTE — Telephone Encounter (Signed)
[facility] request Hydrocodone-APAP. No last refill date given. Dr Sharen Hones out of office.Please advise.

## 2011-12-21 NOTE — Telephone Encounter (Signed)
This looks potentially early.  I would clarify with pharmacy and then forward to get G's approval.

## 2011-12-21 NOTE — Telephone Encounter (Signed)
Phoned the pharmacy.  This is actually right on the day for refill and not early.  Will you please approve in Dr. Timoteo Expose and Dr. Ashok Cordia absence?

## 2011-12-21 NOTE — Telephone Encounter (Signed)
Looked at med tab-- looks like this was already done today?

## 2011-12-21 NOTE — Telephone Encounter (Signed)
Medication phoned to pharmacy.  

## 2011-12-21 NOTE — Telephone Encounter (Signed)
It has not been a month but he is approved for up to 4 a day and was just seen with worsening foot pain  Okay #[id number] x 0

## 2011-12-21 NOTE — Telephone Encounter (Signed)
[facility] was approved for refill today via Dr. Berenda Morale

## 2012-01-07 ENCOUNTER — Other Ambulatory Visit: Payer: Self-pay | Admitting: *Deleted

## 2012-01-07 NOTE — Telephone Encounter (Signed)
Ok to refill? Given 15 day supply at last refill.

## 2012-01-08 ENCOUNTER — Telehealth: Payer: Self-pay | Admitting: Family Medicine

## 2012-01-08 MED ORDER — HYDROCODONE-ACETAMINOPHEN 10-500 MG PO TABS: 1.0000 | ORAL_TABLET | Freq: Four times a day (QID) | ORAL | Status: DC | PRN

## 2012-01-08 NOTE — Telephone Encounter (Signed)
plz phone in. 

## 2012-01-08 NOTE — Telephone Encounter (Signed)
Called in Rx as directed.  

## 2012-01-08 NOTE — Telephone Encounter (Signed)
Kim RPh [facility] is calling to verify a refill request for Hydrocodone 10/500. Rx verifed.        HYDROcodone-acetaminophen (LORTAB) 10-500 MG per tablet [[id number]]  Order Details    Dose: 1 tablet  Route: Oral  Frequency: Every 6 hours PRN    Dispense Quantity: 60 tablet  Refills: 0  Fills Remaining: 0             Sig: Take 1 tablet by mouth every 6 (six) hours as needed.           Written Date: 01/08/12  Expiration Date: 07/06/12       Start Date: 01/07/12  End Date: --       Prescribed by: --  Authorized by: Eustaquio Boyden, MD  Ordering User: Eustaquio Boyden, MD

## 2012-01-21 ENCOUNTER — Ambulatory Visit (INDEPENDENT_AMBULATORY_CARE_PROVIDER_SITE_OTHER): Payer: Medicare Other | Admitting: Family Medicine

## 2012-01-21 ENCOUNTER — Encounter: Payer: Self-pay | Admitting: Family Medicine

## 2012-01-21 VITALS — BP 136/84 | HR 84 | Temp 98.1°F | Wt 148.0 lb

## 2012-01-21 DIAGNOSIS — F17200 Nicotine dependence, unspecified, uncomplicated: Secondary | ICD-10-CM

## 2012-01-21 DIAGNOSIS — W19XXXA Unspecified fall, initial encounter: Secondary | ICD-10-CM

## 2012-01-21 DIAGNOSIS — J449 Chronic obstructive pulmonary disease, unspecified: Secondary | ICD-10-CM

## 2012-01-21 DIAGNOSIS — M79609 Pain in unspecified limb: Secondary | ICD-10-CM

## 2012-01-21 DIAGNOSIS — E1149 Type 2 diabetes mellitus with other diabetic neurological complication: Secondary | ICD-10-CM

## 2012-01-21 DIAGNOSIS — Z720 Tobacco use: Secondary | ICD-10-CM

## 2012-01-21 DIAGNOSIS — E1142 Type 2 diabetes mellitus with diabetic polyneuropathy: Secondary | ICD-10-CM

## 2012-01-21 DIAGNOSIS — I2510 Atherosclerotic heart disease of native coronary artery without angina pectoris: Secondary | ICD-10-CM

## 2012-01-21 DIAGNOSIS — M79673 Pain in unspecified foot: Secondary | ICD-10-CM

## 2012-01-21 NOTE — Assessment & Plan Note (Signed)
Continue to encourage cessation. 

## 2012-01-21 NOTE — Patient Instructions (Addendum)
Next visit will be for physical exam - return at your convenience for this, return prior fasting for blood work. Schedule eye exam for diabetes as you're due. Stop amitriptyline for now.  Continue topamax. Good to see you today, call us with questions.

## 2012-01-21 NOTE — Assessment & Plan Note (Signed)
Discussed changes at home to improve safety. States has not undergone fall prevention with PT, consider this vs home safety eval. Currently temporary living situation - if continued, would go PT route. Uses cane/walker consistently.
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
## 2012-01-21 NOTE — Assessment & Plan Note (Signed)
continue to encourage smoking cessation. Rarely uses albuterol. If plans to undergo spine surgery, will need eval by pulm given hx.

## 2012-01-21 NOTE — Assessment & Plan Note (Signed)
Chronic, deteriorated as of last check. Lab Results  Component Value Date   HGBA1C 7.3* 12/05/2011  compliant with meds - continue.  Recheck when returns for physical.

## 2012-01-21 NOTE — Assessment & Plan Note (Signed)
Thought mostly 2/2 periph neuropathy. Improved on topamax.  Unsure if amitriptyline helped - will stop this.  If needs TCA, consider nortriptyline instead. Also on high dose gabapentin.

## 2012-01-27 ENCOUNTER — Encounter: Payer: Self-pay | Admitting: Family Medicine

## 2012-01-29 ENCOUNTER — Other Ambulatory Visit: Payer: Self-pay | Admitting: *Deleted

## 2012-01-29 DIAGNOSIS — J449 Chronic obstructive pulmonary disease, unspecified: Secondary | ICD-10-CM

## 2012-01-29 DIAGNOSIS — Z720 Tobacco use: Secondary | ICD-10-CM

## 2012-01-29 DIAGNOSIS — C3490 Malignant neoplasm of unspecified part of unspecified bronchus or lung: Secondary | ICD-10-CM

## 2012-01-29 MED ORDER — HYDROCODONE-ACETAMINOPHEN 10-500 MG PO TABS: 1.0000 | ORAL_TABLET | Freq: Four times a day (QID) | ORAL | Status: DC | PRN

## 2012-01-29 NOTE — Telephone Encounter (Signed)
Patient called and said she needs a referral to a "lung specialist" before she can consider surgery for her neck/back. She said her previous dr who did her lung surgery retired. She has no preference on where she goes.   She also requested an abx for a tick bite. No symptoms at all, but said she removed some skin trying to remove the tick.   She also requested refill on vicodin.

## 2012-01-29 NOTE — Telephone Encounter (Signed)
Placed referral to pulmonology for pulmonary clearance prior to spine surgery. Advise likely doesn't need abx for tick bite if not attached for >48 hours - recommend triple abx ointment to skin, update [place] if not improving - watch for fever, rash, joint pains, headache, neck stiffness 1-2 wks after bite. plz phone vicodin in but notify pt that she is 1 wk early on her refill - to try and limit use. Also notify she is due for surveillance CT scan.  Who was her oncologist who followed her lung and kidney cancers?

## 2012-01-30 NOTE — Telephone Encounter (Signed)
Rx called in as directed. Patient notified to limit use as early on RF. Advised to expect call for pulmonology referral. Advised to use triple abx ointment on skin where tick was removed and watch for symptoms for the next 2 weeks. Patient cannot remember who her oncologist was. I advised she was due for repeat CT to check on lung and kidney and would let her know what her next step is. She verbalized understanding.

## 2012-01-30 NOTE — Telephone Encounter (Signed)
i've placed order for CT lungs.  Will also await pulm referral.

## 2012-02-08 ENCOUNTER — Other Ambulatory Visit: Payer: Medicare Other

## 2012-02-12 ENCOUNTER — Other Ambulatory Visit: Payer: Medicare Other

## 2012-02-12 ENCOUNTER — Encounter: Payer: Self-pay | Admitting: Family Medicine

## 2012-02-18 ENCOUNTER — Ambulatory Visit (INDEPENDENT_AMBULATORY_CARE_PROVIDER_SITE_OTHER): Payer: Medicare Other | Admitting: Pulmonary Disease

## 2012-02-18 ENCOUNTER — Encounter: Payer: Self-pay | Admitting: Pulmonary Disease

## 2012-02-18 VITALS — BP 110/60 | HR 85 | Temp 98.4°F | Ht 64.0 in | Wt 145.0 lb

## 2012-02-18 DIAGNOSIS — J411 Mucopurulent chronic bronchitis: Secondary | ICD-10-CM | POA: Insufficient documentation

## 2012-02-18 DIAGNOSIS — F17200 Nicotine dependence, unspecified, uncomplicated: Secondary | ICD-10-CM

## 2012-02-18 DIAGNOSIS — Z720 Tobacco use: Secondary | ICD-10-CM

## 2012-02-18 DIAGNOSIS — J449 Chronic obstructive pulmonary disease, unspecified: Secondary | ICD-10-CM

## 2012-02-18 DIAGNOSIS — J4489 Other specified chronic obstructive pulmonary disease: Secondary | ICD-10-CM

## 2012-02-18 MED ORDER — FORMOTEROL FUMARATE 12 MCG IN CAPS: 12.0000 ug | ORAL_CAPSULE | Freq: Two times a day (BID) | RESPIRATORY_TRACT | Status: AC

## 2012-02-18 NOTE — Assessment & Plan Note (Signed)
Advised at length to quit. We discussed various treatment options including Chantix and nicotine replacement therapy. I advised her of the risk of a second episode of lung cancer, recurrence of lung cancer, progression of her COPD, and vascular disease is associated with ongoing tobacco use. She understands this risk and states at this point she does not want to quit smoking. She is considering quitting cold [place] and refused any medical intervention for her tobacco use.

## 2012-02-18 NOTE — Assessment & Plan Note (Addendum)
Mrs. Marissa Ho is a current everyday smoker who has COPD based on the fact that she has chronic bronchitis symptoms and centrilobular emphysema noted on CT of her chest. Surprisingly she has essentially normal spirometry.  She has at least moderate risk of having a perioperative pulmonary event such as failure to wean or pneumonia. Ultimately the decision whether or not she should undergo the cervical spine intervention will be between her and her neurosurgeon. If she does undergo surgery and she should receive bronchodilators in-house as well as a pulmonary consultation. I think places her at highest risk for a perioperative event is her profound deconditioning.   COPD: GOLD Grade B Combined recommendations from the [employer]	[place], [employer], Designer, television/film set, European Respiratory Society (Qaseem A et al, Ann Intern Med. 2011;155(3):179) recommends tobacco cessation, pulmonary rehab (for symptomatic patients with an FEV1 < 50% predicted), supplemental oxygen (for patients with SaO2 <88% or paO2 <55), and appropriate bronchodilator therapy.  In regards to long acting bronchodilators, they recommend monotherapy (FEV1 60-80% with symptoms weak evidence, FEV1 with symptoms <60% strong evidence), or combination therapy (FEV1 <60% with symptoms, strong recommendation, moderate evidence).  One should also provide patients with annual immunizations and consider therapy for prevention of COPD exacerbations (ie. roflumilast or azithromycin) when appopriate.  -O2 therapy: Not indicated -Immunizations: UTD -Tobacco use: advised at length to quit -Exercise: encouraged daily exercise -Bronchodilator therapy: Start formoterol bid -Exacerbation prevention: n/a

## 2012-02-18 NOTE — Patient Instructions (Addendum)
Start formoterol inhaler one puff twice a day. This is for your shortness of breath and should be used twice a day no matter how you feel.  Stop smoking!  If you decide that you want to use Chantix to quit smoking let us know.  If you and your surgeon decide to undergo the cervical spine surgery, then you should use breathing treatments while in the hospital and have a pulmonologist see you around the time of surgery.  We will see you back in 3 months.

## 2012-02-18 NOTE — Progress Notes (Signed)
Subjective:    Patient ID: Marissa Ho, female    DOB: 1931/11/19, 78 y.o.   MRN: [id number]  HPI  This is a 78 year old female has a past medical history significant for COPD and lung cancer he comes to our clinic to establish care for the first time today. She states that she was referred to me for evaluation of perioperative pulmonary risk assessment. She states that she's been having ongoing neck pain and weakness in her hands which is likely related to cervical stenosis. She and her neurosurgeon (Dr. Lovell Sheehan) are considering a cervical discectomy.  She states that she had a normal childhood without respiratory symptoms but then developed shortness of breath at some point late and adulthood. Her most significant symptom is shortness of breath on exertion. She does not have a chronic cough but she does feel chest congestion. She does not use inhalers have been prescribed for her in the past. She states that she is able to do light housework without too much difficulty but does get short of breath when she runs the vacuum cleaner. She also states that she gets short of breath when she walks around the grocery store.  She underwent a right upper lobectomy for lung cancer in 2008 by Dr. Edwyna Shell.   Past Medical History  Diagnosis Date  . Arthritis     cervical, lumbar  . Depression   . Diabetes mellitus with neuropathy     seen neurologist in past  . GERD (gastroesophageal reflux disease)   . Seasonal allergies   . HTN (hypertension)   . HLD (hyperlipidemia)   . Lung cancer 2006    lung adenocarcinoma s/p RULobectomy, CT scans Q6 mo  . Kidney carcinoma 2005    left kidney ?TCC s/p nephrectomy, sole right kidney remains  . DDD (degenerative disc disease), cervical 07/2011    NSG Dr. Lovell Sheehan, CT myelogram - nerve root cutoff C7-T1, L HNP C5/6, C6/7, C7/T1  . Hepatic steatosis   . CTS (carpal tunnel syndrome) 07/2011    On NCV/EMG- L CTS, with ulnar neuropathy  . COPD (chronic  obstructive pulmonary disease) 09/26/2010    by CXR, normal PFTs per report 2007  . CAD (coronary artery disease), native coronary artery 2006    cath in [place], mod CAD, no focal obstruction, nl LV fxn  . Aortic valve stenosis, mild   . Neck pain     cervical spondylosis, cervicalgia, herniated disc and radiculopathy     Family History  Problem Relation Age of Onset  . Diabetes Mother   . Hyperlipidemia Mother   . Hypertension Mother   . Heart disease Father   . Early death Father 38    CAD/MI  . Hyperlipidemia Father   . Hypertension Father   . Diabetes Sister   . Stroke Sister   . Heart disease Brother   . Cancer Neg Hx   . Diabetes Sister      History   Social History  . Marital Status: Widowed    Spouse Name: N/A    Number of Children: 3  . Years of Education: 8th grade   Occupational History  . Finisher for furniture    Social History Main Topics  . Smoking status: Current Everyday Smoker -- 1.0 packs/day for 50 years    Types: Cigarettes  . Smokeless tobacco: Not on file   Comment: sometimes more; sometimes less  . Alcohol Use: No  . Drug Use: No  . Sexually Active: Not on file  Other Topics Concern  . Not on file   Social History Narrative   Lives alone.  Dog at home.Some friends in area, no family. Goes to church.Children live in [place], [place]     Allergies  Allergen Reactions  . Ace Inhibitors Cough  . Penicillins Rash    Pt unsure, thinks can take pcn.  . Sulfonamide Derivatives Rash     Outpatient Prescriptions Prior to Visit  Medication Sig Dispense Refill  . albuterol (VENTOLIN HFA) 108 (90 BASE) MCG/ACT inhaler Inhale 2 puffs into the lungs every 4 (four) hours as needed for wheezing or shortness of breath.  1 Inhaler  11  . alendronate (FOSAMAX) 35 MG tablet Take 35 mg by mouth every 7 (seven) days. Take with a full glass of water on an empty stomach.  Taken on Sundays.       Marland Kitchen amLODipine (NORVASC) 10 MG tablet TAKE 1 TABLET BY MOUTH  ONCE A DAY  90 tablet  3  . aspirin 81 MG tablet Take 1 tablet (81 mg total) by mouth every other day.      Marland Kitchen atorvastatin (LIPITOR) 40 MG tablet TAKE 1 TABLET BY MOUTH AT BEDTIME  90 tablet  3  . Blood Glucose Monitoring Suppl (BLOOD GLUCOSE METER) kit Freestyle Lite  1 each  0  . CVS VITAMIN D3 1000 UNITS capsule TAKE 1 CAPSULE BY MOUTH ONCE A DAY  30 capsule  10  . gabapentin (NEURONTIN) 300 MG capsule Take 3 capsules (900 mg total) by mouth 3 (three) times daily.  270 capsule  11  . HYDROcodone-acetaminophen (LORTAB) 10-500 MG per tablet Take 1 tablet by mouth every 6 (six) hours as needed.  60 tablet  0  . metFORMIN (GLUCOPHAGE) 500 MG tablet Take 500 mg by mouth 2 (two) times daily with a meal.        . potassium chloride SA (K-DUR,KLOR-CON) 20 MEQ tablet Take 20 mEq by mouth daily.        Marland Kitchen topiramate (TOPAMAX) 25 MG tablet Take 50 mg by mouth 2 (two) times daily.      . valsartan-hydrochlorothiazide (DIOVAN-HCT) 320-12.5 MG per tablet Take 1 tablet by mouth daily.  90 tablet  3      Review of Systems  Constitutional: Negative for fever, chills and unexpected weight change.  HENT: Negative for ear pain, nosebleeds, congestion, sore throat, rhinorrhea, sneezing, trouble swallowing, dental problem, voice change, postnasal drip and sinus pressure.   Eyes: Negative for visual disturbance.  Respiratory: Negative for cough, choking and shortness of breath.   Cardiovascular: Negative for chest pain and leg swelling.  Gastrointestinal: Negative for vomiting, abdominal pain and diarrhea.  Genitourinary: Negative for difficulty urinating.  Musculoskeletal: Negative for arthralgias.  Skin: Negative for rash.  Neurological: Negative for tremors, syncope and headaches.  Hematological: Does not bruise/bleed easily.       Objective:   Physical Exam  Filed Vitals:   02/18/12 1536  BP: 110/60  Pulse: 85  Temp: 98.4 F (36.9 C)  TempSrc: Oral  Height: 5\' 4"  (1.626 m)  Weight: 145 lb  (65.772 kg)  SpO2: 95%   Gen: well appearing, no acute distress HEENT: NCAT, PERRL, EOMi, OP clear, neck supple without masses PULM: Poor air movement, no wheezing, few insp crackles CV: RRR, sysolic murmur RUSB, no JVD AB: BS+, soft, nontender, no hsm Ext: warm,some leg edema noted bilatearlly, no clubbing, no cyanosis Derm: no rash or skin breakdown Neuro: A&Ox4, CN II-XII intact, strength 5/5 in all  4 extremities  In August 2013 simple spirometry normal  2012 CT chest with centrilobular emphysema     Assessment & Plan:   COPD (chronic obstructive pulmonary disease) Mrs. Astorino is a current everyday smoker who has COPD based on the fact that she has chronic bronchitis symptoms and centrilobular emphysema noted on CT of her chest. Surprisingly she has essentially normal spirometry.  She has at least moderate risk of having a perioperative pulmonary event such as failure to wean or pneumonia. Ultimately the decision whether or not she should undergo the cervical spine intervention will be between her and her neurosurgeon. If she does undergo surgery and she should receive bronchodilators in-house as well as a pulmonary consultation. I think places her at highest risk for a perioperative event is her profound deconditioning.   COPD: GOLD Grade B Combined recommendations from the [employer]	[place], [employer], Designer, television/film set, European Respiratory Society (Qaseem A et al, Ann Intern Med. 2011;155(3):179) recommends tobacco cessation, pulmonary rehab (for symptomatic patients with an FEV1 < 50% predicted), supplemental oxygen (for patients with SaO2 <88% or paO2 <55), and appropriate bronchodilator therapy.  In regards to long acting bronchodilators, they recommend monotherapy (FEV1 60-80% with symptoms weak evidence, FEV1 with symptoms <60% strong evidence), or combination therapy (FEV1 <60% with symptoms, strong recommendation, moderate  evidence).  One should also provide patients with annual immunizations and consider therapy for prevention of COPD exacerbations (ie. roflumilast or azithromycin) when appopriate.  -O2 therapy: Not indicated -Immunizations: UTD -Tobacco use: advised at length to quit -Exercise: encouraged daily exercise -Bronchodilator therapy: Start formoterol bid -Exacerbation prevention: n/a   Tobacco abuse Advised at length to quit. We discussed various treatment options including Chantix and nicotine replacement therapy. I advised her of the risk of a second episode of lung cancer, recurrence of lung cancer, progression of her COPD, and vascular disease is associated with ongoing tobacco use. She understands this risk and states at this point she does not want to quit smoking. She is considering quitting cold [place] and refused any medical intervention for her tobacco use.   Updated Medication List Outpatient Encounter Prescriptions as of 02/18/2012  Medication Sig Dispense Refill  . albuterol (VENTOLIN HFA) 108 (90 BASE) MCG/ACT inhaler Inhale 2 puffs into the lungs every 4 (four) hours as needed for wheezing or shortness of breath.  1 Inhaler  11  . alendronate (FOSAMAX) 35 MG tablet Take 35 mg by mouth every 7 (seven) days. Take with a full glass of water on an empty stomach.  Taken on Sundays.       Marland Kitchen amLODipine (NORVASC) 10 MG tablet TAKE 1 TABLET BY MOUTH ONCE A DAY  90 tablet  3  . aspirin 81 MG tablet Take 1 tablet (81 mg total) by mouth every other day.      Marland Kitchen atorvastatin (LIPITOR) 40 MG tablet TAKE 1 TABLET BY MOUTH AT BEDTIME  90 tablet  3  . Blood Glucose Monitoring Suppl (BLOOD GLUCOSE METER) kit Freestyle Lite  1 each  0  . CVS VITAMIN D3 1000 UNITS capsule TAKE 1 CAPSULE BY MOUTH ONCE A DAY  30 capsule  10  . gabapentin (NEURONTIN) 300 MG capsule Take 3 capsules (900 mg total) by mouth 3 (three) times daily.  270 capsule  11  . HYDROcodone-acetaminophen (LORTAB) 10-500 MG per tablet Take  1 tablet by mouth every 6 (six) hours as needed.  60 tablet  0  . metFORMIN (GLUCOPHAGE) 500 MG  tablet Take 500 mg by mouth 2 (two) times daily with a meal.        . potassium chloride SA (K-DUR,KLOR-CON) 20 MEQ tablet Take 20 mEq by mouth daily.        Marland Kitchen topiramate (TOPAMAX) 25 MG tablet Take 50 mg by mouth 2 (two) times daily.      . valsartan-hydrochlorothiazide (DIOVAN-HCT) 320-12.5 MG per tablet Take 1 tablet by mouth daily.  90 tablet  3  . formoterol (FORADIL) 12 MCG capsule for inhaler Place 1 capsule (12 mcg total) into inhaler and inhale 2 (two) times daily.  60 capsule  12

## 2012-02-20 ENCOUNTER — Ambulatory Visit (INDEPENDENT_AMBULATORY_CARE_PROVIDER_SITE_OTHER): Payer: Medicare Other | Admitting: Family Medicine

## 2012-02-20 ENCOUNTER — Encounter: Payer: Self-pay | Admitting: Family Medicine

## 2012-02-20 VITALS — BP 140/80 | HR 80 | Temp 97.7°F | Ht 66.0 in | Wt 145.0 lb

## 2012-02-20 DIAGNOSIS — Z720 Tobacco use: Secondary | ICD-10-CM

## 2012-02-20 DIAGNOSIS — H918X9 Other specified hearing loss, unspecified ear: Secondary | ICD-10-CM

## 2012-02-20 DIAGNOSIS — J4489 Other specified chronic obstructive pulmonary disease: Secondary | ICD-10-CM

## 2012-02-20 DIAGNOSIS — Z0000 Encounter for general adult medical examination without abnormal findings: Secondary | ICD-10-CM

## 2012-02-20 DIAGNOSIS — F17200 Nicotine dependence, unspecified, uncomplicated: Secondary | ICD-10-CM

## 2012-02-20 DIAGNOSIS — H6120 Impacted cerumen, unspecified ear: Secondary | ICD-10-CM

## 2012-02-20 DIAGNOSIS — E1149 Type 2 diabetes mellitus with other diabetic neurological complication: Secondary | ICD-10-CM

## 2012-02-20 DIAGNOSIS — J449 Chronic obstructive pulmonary disease, unspecified: Secondary | ICD-10-CM

## 2012-02-20 DIAGNOSIS — I10 Essential (primary) hypertension: Secondary | ICD-10-CM

## 2012-02-20 DIAGNOSIS — E1142 Type 2 diabetes mellitus with diabetic polyneuropathy: Secondary | ICD-10-CM

## 2012-02-20 DIAGNOSIS — E785 Hyperlipidemia, unspecified: Secondary | ICD-10-CM

## 2012-02-20 LAB — CBC WITH DIFFERENTIAL/PLATELET
Basophils Absolute: 0.1 10*3/uL (ref 0.0–0.1)
Eosinophils Absolute: 0.2 10*3/uL (ref 0.0–0.7)
Lymphocytes Relative: 19.7 % (ref 12.0–46.0)
MCHC: 32.9 g/dL (ref 30.0–36.0)
Neutrophils Relative %: 69.4 % (ref 43.0–77.0)
Platelets: 280 10*3/uL (ref 150.0–400.0)
RDW: 13.9 % (ref 11.5–14.6)

## 2012-02-20 LAB — MICROALBUMIN / CREATININE URINE RATIO
Creatinine,U: 23.2 mg/dL
Microalb Creat Ratio: 7.8 mg/g (ref 0.0–30.0)

## 2012-02-20 LAB — COMPREHENSIVE METABOLIC PANEL
ALT: 15 U/L (ref 0–35)
CO2: 26 mEq/L (ref 19–32)
Calcium: 9.6 mg/dL (ref 8.4–10.5)
Chloride: 100 mEq/L (ref 96–112)
Creatinine, Ser: 0.7 mg/dL (ref 0.4–1.2)
GFR: 80.69 mL/min (ref >60.00)
Sodium: 137 mEq/L (ref 135–145)
Total Protein: 7.3 g/dL (ref 6.0–8.3)

## 2012-02-20 LAB — LIPID PANEL
Cholesterol: 146 mg/dL (ref 0–200)
Total CHOL/HDL Ratio: 5

## 2012-02-20 NOTE — Assessment & Plan Note (Signed)
Disimpaction performed.  Pt appreciative.

## 2012-02-20 NOTE — Assessment & Plan Note (Addendum)
Actually not ready for this today as has not completed form, does not want to stay to complete form. Asked her to return with form completed in next few weeks to review this and complete medicare wellness visit as well as will review blood work then. Fasting blood work today.

## 2012-02-20 NOTE — Progress Notes (Signed)
After wax removal from left ear hearing screen was repeated. Results were: 500 Hz at 40 dBHL, 1000 Hz at 40 dBHL, 2000 Hz at 40 dBHL and 4000 Hz- no results.

## 2012-02-20 NOTE — Patient Instructions (Addendum)
Blood work today. Return with medicare wellness form completed and we will review (return in next few weeks to finish medicare wellness visit). vision good today. Checked hearing again. Good to see you today, call us with quesitons.

## 2012-02-20 NOTE — Assessment & Plan Note (Signed)
Continued to strongly encourage cessation. Not interested in chantix or NRT. Not interested in wellbutrin.

## 2012-02-20 NOTE — Progress Notes (Signed)
  Subjective:    Patient ID: Marissa Ho, female    DOB: 1931-08-22, 78 y.o.   MRN: [id number]  HPI CC: medicare wellness visit - arrived 15 min late, has not completed medicare wellness form.  Recently moved back to [place], living with son. Daughter may be moving back in.   Reviewed recent note from Dr. Kendrick Fries 2d ago for pulm clearance for possible cervical spine surgery - rec start formoterol bid and rec perioperative pulm consult if pt does decide to pursue neck surgery.    Pt saw Dr. Lovell Sheehan and this was discussed - not planning on pursuing surgery currently as neck pain has improved significantly on medical regimen.  Smoking - 1 ppd.  Precontemplative.  Doesn't think can afford high price chantix or nicotine replacement.  Failed hearing screen today, trouble hearing out of left ear.  No pain in ear or discharge/drainage  DM - doesn't regularly check sugars.  Fasting today for blood work.  Too early to recheck A1c. Lab Results  Component Value Date   HGBA1C 7.3* 12/05/2011   Advanced directives - HCPOA is son, Silvio Clayman. Has personal will. No living will. States has talked with children about her wishes. Will work on setting up advanced directive.   Preventative:  Last wellness visit was 01/2011. Endorses recent DEXA at Dr. Jone Baseman office. Mammogram - 2012, normal.  Pap smear - last 1982, s/p hysterectomy 1977.  Colonoscopy - declines. Had one before 2005, looked ok. Doesn't want anymore.  Has had flu shot.  Completed pneumococcal. Has had tetanus shot, due 2018  Had shingles shot in past.  Reviewed blood work with patient.   Review of Systems  per HPI     Objective:   Physical Exam  Nursing note and vitals reviewed. Constitutional: She is oriented to person, place, and time. She appears well-developed and well-nourished. No distress.  HENT:  Head: Normocephalic and atraumatic.  Right Ear: Hearing, tympanic membrane, external ear and ear canal normal.    Left Ear: Tympanic membrane, external ear and ear canal normal. Decreased hearing is noted.  Nose: Nose normal.  Mouth/Throat: Oropharynx is clear and moist. No oropharyngeal exudate.       L ear with cerumen plug affecting hearing. With plastic curette, disimpaction performed. Repeated hearing exam on left.  Eyes: Conjunctivae and EOM are normal. Pupils are equal, round, and reactive to light. No scleral icterus.  Neck: Normal range of motion. Neck supple.  Musculoskeletal: Normal range of motion. She exhibits no edema.  Lymphadenopathy:    She has no cervical adenopathy.  Neurological: She is alert and oriented to person, place, and time.       CN grossly intact, station and gait intact  Skin: Skin is warm and dry.  Psychiatric: She has a normal mood and affect. Her behavior is normal. Judgment and thought content normal.       Assessment & Plan:

## 2012-02-22 ENCOUNTER — Institutional Professional Consult (permissible substitution): Payer: Medicare Other | Admitting: Pulmonary Disease

## 2012-02-25 ENCOUNTER — Other Ambulatory Visit: Payer: Self-pay | Admitting: *Deleted

## 2012-02-25 MED ORDER — HYDROCODONE-ACETAMINOPHEN 10-500 MG PO TABS
1.0000 | ORAL_TABLET | Freq: Four times a day (QID) | ORAL | Status: DC | PRN
Start: 2012-02-25 — End: 2012-03-24

## 2012-02-25 NOTE — Telephone Encounter (Signed)
Rx called in as directed.   

## 2012-02-25 NOTE — Telephone Encounter (Signed)
OK to refill

## 2012-02-25 NOTE — Telephone Encounter (Signed)
plz phone in. 

## 2012-02-26 ENCOUNTER — Other Ambulatory Visit: Payer: Medicare Other

## 2012-03-04 ENCOUNTER — Encounter: Payer: Medicare Other | Admitting: Family Medicine

## 2012-03-18 ENCOUNTER — Ambulatory Visit (INDEPENDENT_AMBULATORY_CARE_PROVIDER_SITE_OTHER): Payer: Medicare Other | Admitting: Family Medicine

## 2012-03-18 ENCOUNTER — Telehealth: Payer: Self-pay | Admitting: *Deleted

## 2012-03-18 ENCOUNTER — Encounter: Payer: Self-pay | Admitting: Family Medicine

## 2012-03-18 VITALS — BP 132/84 | HR 76 | Temp 97.4°F | Wt 141.2 lb

## 2012-03-18 DIAGNOSIS — R413 Other amnesia: Secondary | ICD-10-CM

## 2012-03-18 DIAGNOSIS — Z720 Tobacco use: Secondary | ICD-10-CM

## 2012-03-18 DIAGNOSIS — Z1211 Encounter for screening for malignant neoplasm of colon: Secondary | ICD-10-CM

## 2012-03-18 DIAGNOSIS — F329 Major depressive disorder, single episode, unspecified: Secondary | ICD-10-CM

## 2012-03-18 DIAGNOSIS — E785 Hyperlipidemia, unspecified: Secondary | ICD-10-CM

## 2012-03-18 DIAGNOSIS — Z1231 Encounter for screening mammogram for malignant neoplasm of breast: Secondary | ICD-10-CM

## 2012-03-18 DIAGNOSIS — J449 Chronic obstructive pulmonary disease, unspecified: Secondary | ICD-10-CM

## 2012-03-18 DIAGNOSIS — Z0000 Encounter for general adult medical examination without abnormal findings: Secondary | ICD-10-CM

## 2012-03-18 NOTE — Progress Notes (Signed)
Subjective:    Patient ID: Marissa Ho, female    DOB: 03/26/1932, 78 y.o.   MRN: [id number]  HPI CC: medicare wellness exam  See prior note for details.  Presented early Aug for AMW visit, changed to today to finish exam.  DM - eye exam 04/03/2012.   Lab Results  Component Value Date   HGBA1C 7.3* 12/05/2011   Smoking - decreased smoking, changed brand.  COPD - not taking foradil - caused burning tongue.  Has had 3 rounds of chiggers.   Planning on staying permanently in [place] with son - but desires to continue driving here to see me.  bought mobile home, placed on son's property.  Vision screen passed last visit. Hearing screen passed after cerumen disimpaction perfomed last visit.  Preventative:  Last wellness visit was 01/2011.  Endorses recent DEXA at Dr. Jone Baseman office. Mammogram - states 2012, normal but no records. Pap smear - last 1982, s/p hysterectomy 1977.  May age out. Colonoscopy - declines. Had one before 2005, looked ok. Doesn't want anymore.  Told colon crooked.  Would like kit. Has had flu shot.  Completed pneumococcal. Has had tetanus shot, due 2018  Had shingles shot in past.   Advanced directives - HCPOA is son, Silvio Clayman. Has personal will. No living will. States has talked with children about her wishes. Will work on setting up advanced directive.   reviewed blood work with patient  Lives near son in [place], mobile home.  Dog at home. Some friends in area. Goes to church.  Medications and allergies reviewed and updated in chart.  Past histories reviewed and updated if relevant as below. Patient Active Problem List  Diagnosis  . DIABETES MELLITUS, TYPE II, CONTROLLED, W/NEURO COMPS  . HTN (hypertension)  . COPD (chronic obstructive pulmonary disease)  . Tobacco abuse  . Arthritis  . Depression  . GERD (gastroesophageal reflux disease)  . Seasonal allergies  . HLD (hyperlipidemia)  . Lung cancer, hx  . Kidney carcinoma, hx  .  Foot pain  . Medicare annual wellness visit, subsequent  . Memory problem  . Orofacial dyskinesia  . Systolic murmur  . DDD (degenerative disc disease), cervical  . Imbalance  . CAD (coronary artery disease), native coronary artery  . Aortic valve stenosis, mild  . Chronic pain  . Falls  . Chronic bronchitis, mucopurulent  . Hearing loss secondary to cerumen impaction   Past Medical History  Diagnosis Date  . Arthritis     cervical, lumbar  . Depression   . Diabetes mellitus with neuropathy     seen neurologist in past  . GERD (gastroesophageal reflux disease)   . Seasonal allergies   . HTN (hypertension)   . HLD (hyperlipidemia)   . Lung cancer 2006    lung adenocarcinoma s/p RULobectomy, CT scans Q6 mo  . Kidney carcinoma 2005    left kidney ?TCC s/p nephrectomy, sole right kidney remains  . DDD (degenerative disc disease), cervical 07/2011    NSG Dr. Lovell Sheehan, CT myelogram - nerve root cutoff C7-T1, L HNP C5/6, C6/7, C7/T1  . Hepatic steatosis   . CTS (carpal tunnel syndrome) 07/2011    On NCV/EMG- L CTS, with ulnar neuropathy  . COPD (chronic obstructive pulmonary disease) 09/26/2010    by CXR, normal PFTs per report 2007  . CAD (coronary artery disease), native coronary artery 2006    cath in [place], mod CAD, no focal obstruction, nl LV fxn  . Aortic valve stenosis, mild   .  Neck pain     cervical spondylosis, cervicalgia, herniated disc and radiculopathy   Past Surgical History  Procedure Date  . Abdominal hysterectomy 1977    TAH  . Lumbar disc surgery 1950 and 1970s  . Breast excisional biopsy 1970  . Lung cancer surgery 2007    R upper lobectomy, Dr. Edwyna Shell  . Nephrectomy 2004    L removed, Dr. Vernie Ammons  . Spirometry 07/2005    PFTs WNL, good response to bronchodilator  . Lumbar sympathetic block 03/2011    left sided L2-4 for diabetic neuropathy  . US echocardiography 05/2011    EF 55-60%, grade I diastolic dysfunction, mild aortic stenosis and mild  mitral regurg  . Mri cervical spine 05/2011    multilevel DDD with thecal sac stenosis and neural foraminal narrowing, concern for bilateral exiting nerve root compression, ?T1 vertebral body edema  . Cardiac catheterization 10/2004    [place]; nl LV fxn, mod CAD, no focal stenosis  . Abis 10/2011    normal  . Lumbar sympathetic block 10/2011    L2-4  . Nuclear stress test 10/2011    WNL, normal EF, no evidence of ischemia   History  Substance Use Topics  . Smoking status: Current Everyday Smoker -- 1.0 packs/day for 50 years    Types: Cigarettes  . Smokeless tobacco: Never Used   Comment: sometimes more; sometimes less  . Alcohol Use: No   Family History  Problem Relation Age of Onset  . Diabetes Mother   . Hyperlipidemia Mother   . Hypertension Mother   . Heart disease Father   . Early death Father 40    CAD/MI  . Hyperlipidemia Father   . Hypertension Father   . Diabetes Sister   . Stroke Sister   . Heart disease Brother   . Cancer Neg Hx   . Diabetes Sister    Allergies  Allergen Reactions  . Ace Inhibitors Cough  . Penicillins Rash    Pt unsure, thinks can take pcn.  . Sulfonamide Derivatives Rash   Current Outpatient Prescriptions on File Prior to Visit  Medication Sig Dispense Refill  . albuterol (VENTOLIN HFA) 108 (90 BASE) MCG/ACT inhaler Inhale 2 puffs into the lungs every 4 (four) hours as needed for wheezing or shortness of breath.  1 Inhaler  11  . alendronate (FOSAMAX) 35 MG tablet Take 35 mg by mouth every 7 (seven) days. Take with a full glass of water on an empty stomach.  Taken on Sundays.       Marland Kitchen amLODipine (NORVASC) 10 MG tablet TAKE 1 TABLET BY MOUTH ONCE A DAY  90 tablet  3  . aspirin 81 MG tablet Take 1 tablet (81 mg total) by mouth every other day.      Marland Kitchen atorvastatin (LIPITOR) 40 MG tablet TAKE 1 TABLET BY MOUTH AT BEDTIME  90 tablet  3  . Blood Glucose Monitoring Suppl (BLOOD GLUCOSE METER) kit Freestyle Lite  1 each  0  . CVS VITAMIN D3 1000  UNITS capsule TAKE 1 CAPSULE BY MOUTH ONCE A DAY  30 capsule  10  . formoterol (FORADIL) 12 MCG capsule for inhaler Place 1 capsule (12 mcg total) into inhaler and inhale 2 (two) times daily.  60 capsule  12  . gabapentin (NEURONTIN) 300 MG capsule Take 3 capsules (900 mg total) by mouth 3 (three) times daily.  270 capsule  11  . HYDROcodone-acetaminophen (LORTAB) 10-500 MG per tablet Take 1 tablet by mouth every 6 (  six) hours as needed.  60 tablet  0  . metFORMIN (GLUCOPHAGE) 500 MG tablet Take 500 mg by mouth 2 (two) times daily with a meal.        . potassium chloride SA (K-DUR,KLOR-CON) 20 MEQ tablet Take 20 mEq by mouth daily.        . silver sulfADIAZINE (SILVADENE) 1 % cream Apply 1 application topically 2 (two) times daily.      Marland Kitchen topiramate (TOPAMAX) 25 MG tablet Take 50 mg by mouth 2 (two) times daily.      . valsartan-hydrochlorothiazide (DIOVAN-HCT) 320-12.5 MG per tablet Take 1 tablet by mouth daily.  90 tablet  3     Review of Systems  Constitutional: Negative for fever, chills, activity change, appetite change, fatigue and unexpected weight change.  HENT: Negative for hearing loss and neck pain.   Eyes: Negative for visual disturbance.  Respiratory: Negative for cough, chest tightness, shortness of breath and wheezing.   Cardiovascular: Negative for chest pain, palpitations and leg swelling.  Gastrointestinal: Negative for nausea, vomiting, abdominal pain, diarrhea, constipation, blood in stool and abdominal distention.  Genitourinary: Negative for hematuria and difficulty urinating.  Musculoskeletal: Negative for myalgias and arthralgias.  Skin: Negative for rash.  Neurological: Negative for dizziness, seizures, syncope and headaches.  Hematological: Does not bruise/bleed easily.  Psychiatric/Behavioral: Negative for dysphoric mood. The patient is not nervous/anxious.        Objective:   Physical Exam  Nursing note and vitals reviewed. Constitutional: She is oriented to  person, place, and time. She appears well-developed and well-nourished. No distress.       Walks with cane  HENT:  Head: Normocephalic and atraumatic.  Right Ear: External ear normal.  Left Ear: External ear normal.  Nose: Nose normal.  Mouth/Throat: Oropharynx is clear and moist. No oropharyngeal exudate.  Eyes: Conjunctivae and EOM are normal. Pupils are equal, round, and reactive to light. No scleral icterus.  Neck: Normal range of motion. Neck supple. Carotid bruit is not present.  Cardiovascular: Normal rate, regular rhythm and intact distal pulses.   Murmur (3/6 SEM) heard. Pulses:      Radial pulses are 2+ on the right side, and 2+ on the left side.  Pulmonary/Chest: Effort normal and breath sounds normal. No respiratory distress. She has no wheezes. She has no rales. Right breast exhibits no inverted nipple, no mass, no nipple discharge, no skin change and no tenderness. Left breast exhibits no inverted nipple, no mass, no nipple discharge, no skin change and no tenderness. Breasts are symmetrical.       coarse  Abdominal: Soft. Bowel sounds are normal. She exhibits no distension and no mass. There is no tenderness. There is no rebound and no guarding.  Musculoskeletal: Normal range of motion. She exhibits no edema.  Lymphadenopathy:    She has no cervical adenopathy.    She has no axillary adenopathy.       Right axillary: No lateral adenopathy present.       Left axillary: No lateral adenopathy present.      Right: No supraclavicular adenopathy present.       Left: No supraclavicular adenopathy present.  Neurological: She is alert and oriented to person, place, and time.       CN grossly intact, station and gait intact  Skin: Skin is warm and dry. No rash noted.  Psychiatric: She has a normal mood and affect. Her behavior is normal. Judgment and thought content normal.  Assessment & Plan:
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
## 2012-03-18 NOTE — Telephone Encounter (Signed)
Noted. Thanks.  I agree with locking meds.

## 2012-03-18 NOTE — Patient Instructions (Addendum)
Pass by Marion's office to schedule appointment for mammogram. Stool kit today. Keep working on decreasing smoking. Decrease added sugars, eliminate trans fats, increase fiber and limit alcohol.  All these changes together can drop triglycerides by almost 50%. Sign release for records from Dr. Valentina Lucks specifically for bone density scan. Return in 2-3 months for 30 min appt for geriatric assessment.

## 2012-03-19 ENCOUNTER — Encounter: Payer: Self-pay | Admitting: Family Medicine

## 2012-03-19 NOTE — Assessment & Plan Note (Signed)
reviewed blood work with pt - trig too high.  Discussed dietary recommendations to improve this. Lab Results  Component Value Date   CHOL 146 02/20/2012   HDL 30.80* 02/20/2012   LDLCALC 74 08/06/2011   LDLDIRECT 78.7 02/20/2012   TRIG 232.0* 02/20/2012   CHOLHDL 5 02/20/2012

## 2012-03-19 NOTE — Assessment & Plan Note (Signed)
Will recheck MMSE at next appt.   Last MMSE 03/2011 = 25/30 (likely 25/25 given 8th grade education, missed calculation and 1/3 recall)

## 2012-03-19 NOTE — Assessment & Plan Note (Signed)
continue to encourage cessation.  Pt trying to cut back on own.

## 2012-03-19 NOTE — Assessment & Plan Note (Signed)
foradil caused tongue burning so currently off. Takes albuterol as needed.

## 2012-03-19 NOTE — Assessment & Plan Note (Signed)
I have personally reviewed the Medicare Annual Wellness questionnaire and have noted 1. The patient's medical and social history 2. Their use of alcohol, tobacco or illicit drugs 3. Their current medications and supplements 4. The patient's functional ability including ADL's, fall risks, home safety risks and hearing or visual impairment. 5. Diet and physical activity 6. Evidence for depression or mood disorders The patients weight, height, BMI have been recorded in the chart.  Hearing and vision has been addressed. I have made referrals, counseling and provided education to the patient based review of the above and I have provided the pt with a written personalized care plan for preventive services. See scanned questionairre. Advanced directives discussed: wants son Silvio Clayman to be HCPOA.  Working on advanced directive  Reviewed preventative protocols and updated unless pt declined.  Scheduled mammogram.  Sent home with stool kit. Some noted and endorsed trouble with memory - have asked her to return for formal MMSE and geriatric assessment.

## 2012-03-24 ENCOUNTER — Other Ambulatory Visit: Payer: Self-pay | Admitting: *Deleted

## 2012-03-24 MED ORDER — HYDROCODONE-ACETAMINOPHEN 10-500 MG PO TABS: 1.0000 | ORAL_TABLET | Freq: Four times a day (QID) | ORAL | Status: DC | PRN

## 2012-03-24 NOTE — Assessment & Plan Note (Signed)
Will need to discuss antidepressant at next visit (geriatric assessment).

## 2012-03-24 NOTE — Telephone Encounter (Signed)
A few days early.  Ok to phone in.

## 2012-03-24 NOTE — Telephone Encounter (Signed)
OK to refill

## 2012-03-24 NOTE — Telephone Encounter (Signed)
Rx called in as directed.   

## 2012-03-26 ENCOUNTER — Other Ambulatory Visit: Payer: Self-pay | Admitting: Family Medicine

## 2012-04-09 ENCOUNTER — Other Ambulatory Visit: Payer: Self-pay | Admitting: Family Medicine

## 2012-04-18 ENCOUNTER — Other Ambulatory Visit: Payer: Self-pay | Admitting: *Deleted

## 2012-04-18 NOTE — Telephone Encounter (Signed)
OK to refill

## 2012-04-19 MED ORDER — HYDROCODONE-ACETAMINOPHEN 10-500 MG PO TABS: 1.0000 | ORAL_TABLET | Freq: Four times a day (QID) | ORAL | Status: DC | PRN

## 2012-04-19 NOTE — Telephone Encounter (Signed)
plz phone in. 

## 2012-04-21 NOTE — Telephone Encounter (Signed)
Rx called in as directed.   

## 2012-04-22 ENCOUNTER — Ambulatory Visit: Payer: Medicare Other

## 2012-04-23 ENCOUNTER — Other Ambulatory Visit: Payer: Self-pay | Admitting: Family Medicine

## 2012-05-01 ENCOUNTER — Encounter: Payer: Self-pay | Admitting: Family Medicine

## 2012-05-12 ENCOUNTER — Other Ambulatory Visit: Payer: Self-pay | Admitting: *Deleted

## 2012-05-12 MED ORDER — HYDROCODONE-ACETAMINOPHEN 10-500 MG PO TABS: 1.0000 | ORAL_TABLET | Freq: Four times a day (QID) | ORAL | Status: DC | PRN

## 2012-05-12 NOTE — Telephone Encounter (Signed)
Rx called in as directed.   

## 2012-05-12 NOTE — Telephone Encounter (Signed)
plz phone in. 

## 2012-06-02 ENCOUNTER — Other Ambulatory Visit: Payer: Self-pay | Admitting: *Deleted

## 2012-06-02 MED ORDER — HYDROCODONE-ACETAMINOPHEN 10-500 MG PO TABS: 1.0000 | ORAL_TABLET | Freq: Four times a day (QID) | ORAL | Status: DC | PRN

## 2012-06-02 NOTE — Telephone Encounter (Signed)
Ok to refill 

## 2012-06-02 NOTE — Telephone Encounter (Signed)
plz phone in. 

## 2012-06-03 ENCOUNTER — Telehealth: Payer: Self-pay | Admitting: *Deleted

## 2012-06-03 NOTE — Telephone Encounter (Signed)
Was supposed to be 06/05/2012.  Last refill was 05/12/2012 so >1 wk early request.  Fine to fill early for now.

## 2012-06-03 NOTE — Telephone Encounter (Signed)
Rx called in as directed.   

## 2012-06-03 NOTE — Telephone Encounter (Signed)
Called pharmacy and advised. Attempted to notify patient. No answer and no machine.

## 2012-06-03 NOTE — Telephone Encounter (Signed)
I called in vicodin this AM. Comment on Rx said to "fill after 05-05-12". Was this supposed to be 06-05-12? Patient is out of meds and needs refill today.

## 2012-06-17 ENCOUNTER — Ambulatory Visit (INDEPENDENT_AMBULATORY_CARE_PROVIDER_SITE_OTHER): Payer: Medicare Other | Admitting: Family Medicine

## 2012-06-17 ENCOUNTER — Encounter: Payer: Self-pay | Admitting: Family Medicine

## 2012-06-17 VITALS — BP 112/66 | HR 72 | Temp 97.8°F | Wt 135.5 lb

## 2012-06-17 DIAGNOSIS — Z720 Tobacco use: Secondary | ICD-10-CM

## 2012-06-17 DIAGNOSIS — R413 Other amnesia: Secondary | ICD-10-CM

## 2012-06-17 DIAGNOSIS — I10 Essential (primary) hypertension: Secondary | ICD-10-CM

## 2012-06-17 DIAGNOSIS — M79609 Pain in unspecified limb: Secondary | ICD-10-CM

## 2012-06-17 DIAGNOSIS — M79673 Pain in unspecified foot: Secondary | ICD-10-CM

## 2012-06-17 DIAGNOSIS — E1149 Type 2 diabetes mellitus with other diabetic neurological complication: Secondary | ICD-10-CM

## 2012-06-17 DIAGNOSIS — E119 Type 2 diabetes mellitus without complications: Secondary | ICD-10-CM

## 2012-06-17 DIAGNOSIS — F17200 Nicotine dependence, unspecified, uncomplicated: Secondary | ICD-10-CM

## 2012-06-17 LAB — HEMOGLOBIN A1C: Hgb A1c MFr Bld: 6.7 % — ABNORMAL HIGH (ref 4.6–6.5)

## 2012-06-17 NOTE — Assessment & Plan Note (Addendum)
Check A1c today.  Pt reports control.  Anticipate improved from last visit 2/2 weight loss noted. Will refer to podiatry for assistance with foot care given pt concerned about self-care and signficiant periph neuropathy.

## 2012-06-17 NOTE — Addendum Note (Signed)
Addended by: Eustaquio Boyden on: 06/17/2012 10:14 AM   Modules accepted: Orders

## 2012-06-17 NOTE — Assessment & Plan Note (Signed)
Continue to encourage cessation.  Discussed methods to quit.  Pt has tried several methods including hypnosis.

## 2012-06-17 NOTE — Progress Notes (Signed)
Subjective:    Patient ID: Marissa Ho, female    DOB: 07/13/1932, 78 y.o.   MRN: [id number]  HPI CC: geriatric assessment.  Seen here 03/18/2012 for annual medicare wellness visit.  Endorsed some trouble with memory last visit, so asked to return today for formal MMSE and geriatric assessment.  Actually pt wants to discuss some other issues rather than do geriatric assessment today.  Recently saw Dr. Vernie Ammons urologist - found to have UTI although pt had no sxs.  treated with cipro twice daily for 5 days.  ?asxs bacteriuria.  Sees Dr. Vernie Ammons yearly for h/o kidney cancer.  Recently saw Dr. Ollen Bowl for foot pain - gabapentin and topamax.  Work well for pain but noticing numbness remaining.  Taking gabapentin 900mg  tid as well as topamax 50mg  bid.  Also takes hydrocodone regularly about 2/day.  DM - cataracts found, may need surgery next year.  Complaint with meds.  Sugars running well.  States diet is doing well.  Foot exam today.  States eye exam done 03/2012. Lab Results  Component Value Date   HGBA1C 7.3* 12/05/2011   Wt Readings from Last 3 Encounters:  06/17/12 135 lb 8 oz (61.462 kg)  03/18/12 141 lb 4 oz (64.071 kg)  02/20/12 145 lb (65.772 kg)  weight down.  Vision screen passed last visit.  Hearing screen passed after cerumen disimpaction perfomed last visit.   Past Medical History  Diagnosis Date  . Arthritis     cervical, lumbar  . Depression   . Diabetes mellitus with neuropathy     seen neurologist in past  . GERD (gastroesophageal reflux disease)   . Seasonal allergies   . HTN (hypertension)   . HLD (hyperlipidemia)   . Lung cancer 2006    lung adenocarcinoma s/p RULobectomy, CT scans Q6 mo  . Kidney carcinoma 2005    left kidney ?TCC s/p nephrectomy, sole right kidney remains  . DDD (degenerative disc disease), cervical 07/2011    NSG Dr. Lovell Sheehan, CT myelogram - nerve root cutoff C7-T1, L HNP C5/6, C6/7, C7/T1  . Hepatic steatosis   . CTS (carpal tunnel  syndrome) 07/2011    On NCV/EMG- L CTS, with ulnar neuropathy  . COPD (chronic obstructive pulmonary disease) 09/26/2010    by CXR, normal PFTs per report 2007  . CAD (coronary artery disease), native coronary artery 2006    cath in [place], mod CAD, no focal obstruction, nl LV fxn  . Aortic valve stenosis, mild   . Neck pain     cervical spondylosis, cervicalgia, herniated disc and radiculopathy    Review of Systems Per HPI    Objective:   Physical Exam  Nursing note and vitals reviewed. Constitutional: She appears well-developed and well-nourished. No distress.  HENT:  Head: Normocephalic and atraumatic.  Mouth/Throat: Oropharynx is clear and moist. No oropharyngeal exudate.  Eyes: Conjunctivae normal and EOM are normal. Pupils are equal, round, and reactive to light. No scleral icterus.  Neck: Normal range of motion. Neck supple. Carotid bruit is not present.  Cardiovascular: Normal rate, regular rhythm and intact distal pulses.   Murmur heard. Pulmonary/Chest: Effort normal and breath sounds normal. No respiratory distress. She has no wheezes. She has no rales.  Musculoskeletal: She exhibits no edema.       Diabetic foot exam: Normal inspection No skin breakdown No calluses  Normal DP/PT pulses Normal sensation to light tough and decreased to monofilament Nails thickened.  Big toe nails absent  Lymphadenopathy:  She has no cervical adenopathy.  Skin: Skin is warm and dry.       Assessment & Plan:

## 2012-06-17 NOTE — Patient Instructions (Signed)
blood work today. Pass by Marion's office to set up foot doctor appointment (See if we can set you up in .) Good to see you today, call us with questions. Return in 4-6 months for geriatric assessment.

## 2012-06-17 NOTE — Assessment & Plan Note (Signed)
On high dose gabapentin and topamax.  Discussed increase in topamax, pt prefers to monitor for now.

## 2012-06-17 NOTE — Assessment & Plan Note (Signed)
Chronic, stable. Continue meds. BP Readings from Last 3 Encounters:  06/17/12 112/66  03/18/12 132/84  02/20/12 140/80

## 2012-06-17 NOTE — Assessment & Plan Note (Signed)
Pt requests deferral of geriatric assessment to next visit.

## 2012-06-18 ENCOUNTER — Encounter: Payer: Self-pay | Admitting: *Deleted

## 2012-06-23 ENCOUNTER — Other Ambulatory Visit: Payer: Self-pay | Admitting: Family Medicine

## 2012-06-23 ENCOUNTER — Telehealth: Payer: Self-pay | Admitting: Pulmonary Disease

## 2012-06-23 ENCOUNTER — Other Ambulatory Visit: Payer: Self-pay | Admitting: *Deleted

## 2012-06-23 MED ORDER — HYDROCODONE-ACETAMINOPHEN 10-500 MG PO TABS: 1.0000 | ORAL_TABLET | Freq: Four times a day (QID) | ORAL | Status: DC | PRN

## 2012-06-23 NOTE — Telephone Encounter (Signed)
Ok to refill 

## 2012-06-23 NOTE — Telephone Encounter (Signed)
plz phone in. 

## 2012-06-23 NOTE — Telephone Encounter (Signed)
Made in error

## 2012-06-24 NOTE — Telephone Encounter (Signed)
Rx called in as directed.   

## 2012-07-15 ENCOUNTER — Other Ambulatory Visit: Payer: Self-pay | Admitting: *Deleted

## 2012-07-15 MED ORDER — HYDROCODONE-ACETAMINOPHEN 10-500 MG PO TABS
1.0000 | ORAL_TABLET | Freq: Four times a day (QID) | ORAL | Status: DC | PRN
Start: 2012-07-15 — End: 2012-08-08

## 2012-07-15 NOTE — Telephone Encounter (Signed)
plz phone in. 

## 2012-07-15 NOTE — Telephone Encounter (Signed)
Ok to refill 

## 2012-07-15 NOTE — Telephone Encounter (Signed)
Rx called in as directed.   

## 2012-07-17 ENCOUNTER — Telehealth: Payer: Self-pay

## 2012-07-17 NOTE — Telephone Encounter (Signed)
Pt left message at front desk requesting call back by Selena Batten; would not leave any other info. Left v/m requesting pt call back.

## 2012-07-22 NOTE — Telephone Encounter (Signed)
I called pt and pt already has pain med and does not need further assistance.

## 2012-08-08 ENCOUNTER — Other Ambulatory Visit: Payer: Self-pay | Admitting: *Deleted

## 2012-08-09 MED ORDER — HYDROCODONE-ACETAMINOPHEN 10-500 MG PO TABS: 1.0000 | ORAL_TABLET | Freq: Four times a day (QID) | ORAL | Status: DC | PRN

## 2012-08-09 NOTE — Telephone Encounter (Signed)
Plz phone in

## 2012-08-11 NOTE — Telephone Encounter (Signed)
Rx called in as directed. Pharmacist advised they are doing away with 500 mg acetaminphen so you may want to consider something else for her next time.

## 2012-08-15 ENCOUNTER — Ambulatory Visit (INDEPENDENT_AMBULATORY_CARE_PROVIDER_SITE_OTHER): Payer: Medicare Other | Admitting: Family Medicine

## 2012-08-15 ENCOUNTER — Encounter: Payer: Self-pay | Admitting: Family Medicine

## 2012-08-15 VITALS — BP 122/60 | HR 78 | Temp 97.6°F | Wt 135.0 lb

## 2012-08-15 DIAGNOSIS — Z720 Tobacco use: Secondary | ICD-10-CM

## 2012-08-15 DIAGNOSIS — F17200 Nicotine dependence, unspecified, uncomplicated: Secondary | ICD-10-CM

## 2012-08-15 DIAGNOSIS — H918X9 Other specified hearing loss, unspecified ear: Secondary | ICD-10-CM

## 2012-08-15 DIAGNOSIS — H6120 Impacted cerumen, unspecified ear: Secondary | ICD-10-CM

## 2012-08-15 NOTE — Assessment & Plan Note (Signed)
Continue to encourage cessation.  To notify us if desires assistance.  precontemplative.

## 2012-08-15 NOTE — Assessment & Plan Note (Addendum)
Impaction L ear affecting hearing. Irrigation and disimpaction performed. Pt tolerated well. R side clear.

## 2012-08-15 NOTE — Patient Instructions (Addendum)
R ear irrigation today. Keep clean with a few drops of dilute hydrogen peroxide every few weeks.  Try to avoid qtips.

## 2012-08-15 NOTE — Progress Notes (Addendum)
  Subjective:    Patient ID: Marissa Ho, female    DOB: 10/13/1931, 79 y.o.   MRN: [id number]  HPI CC: ears stopped up  Chronic problem.  Bilateral, causing muffled hearing.  Itching.  No pain, fevers.    Has been using qtip to clean.  Smoking - 1 ppd  Past Medical History  Diagnosis Date  . Arthritis     cervical, lumbar  . Depression   . Diabetes mellitus with neuropathy     seen neurologist in past  . GERD (gastroesophageal reflux disease)   . Seasonal allergies   . HTN (hypertension)   . HLD (hyperlipidemia)   . Lung cancer 2006    lung adenocarcinoma s/p RULobectomy, CT scans Q6 mo  . Kidney carcinoma 2005    left kidney ?TCC s/p nephrectomy, sole right kidney remains  . DDD (degenerative disc disease), cervical 07/2011    NSG Dr. Lovell Sheehan, CT myelogram - nerve root cutoff C7-T1, L HNP C5/6, C6/7, C7/T1  . Hepatic steatosis   . CTS (carpal tunnel syndrome) 07/2011    On NCV/EMG- L CTS, with ulnar neuropathy  . COPD (chronic obstructive pulmonary disease) 09/26/2010    by CXR, normal PFTs per report 2007  . CAD (coronary artery disease), native coronary artery 2006    cath in [place], mod CAD, no focal obstruction, nl LV fxn  . Aortic valve stenosis, mild   . Neck pain     cervical spondylosis, cervicalgia, herniated disc and radiculopathy    Review of Systems Per HPI    Objective:   Physical Exam  Nursing note and vitals reviewed. Constitutional: She appears well-developed and well-nourished. No distress.  HENT:  Head: Normocephalic and atraumatic.  Right Ear: Hearing, tympanic membrane, external ear and ear canal normal.  Left Ear: External ear and ear canal normal. Decreased hearing is noted.  Mouth/Throat: Oropharynx is clear and moist. No oropharyngeal exudate.       L cerumen impaction.   After disimpaction, able to visualize TM - clear canals.  Pt tolerated well  Lymphadenopathy:    She has no cervical adenopathy.      Assessment & Plan:

## 2012-08-25 ENCOUNTER — Telehealth: Payer: Self-pay | Admitting: *Deleted

## 2012-08-25 NOTE — Telephone Encounter (Signed)
PA form for Diovan in your IN box. (insurance prefers Hyzaar and Avalide)

## 2012-08-26 NOTE — Telephone Encounter (Signed)
Filled and placed in Kim's box. 

## 2012-08-26 NOTE — Telephone Encounter (Signed)
Form faxed. Will await response.

## 2012-09-01 NOTE — Telephone Encounter (Signed)
Approved and pharmacy notified  

## 2012-09-05 ENCOUNTER — Other Ambulatory Visit: Payer: Self-pay | Admitting: *Deleted

## 2012-09-05 MED ORDER — HYDROCODONE-ACETAMINOPHEN 10-325 MG PO TABS: 1.0000 | ORAL_TABLET | Freq: Four times a day (QID) | ORAL | Status: DC | PRN

## 2012-09-05 NOTE — Telephone Encounter (Signed)
Rx phoned to pharmacy.  

## 2012-09-05 NOTE — Telephone Encounter (Addendum)
Last filled hydro-acet 10-500  08/12/12, per pharmacy it is not available with 500 mg of tyl. Request 10/325 mg rx. ( I changed rx in emr to 10/325 but used same sig). Is it ok to fill?

## 2012-09-05 NOTE — Telephone Encounter (Signed)
plz phone in. 

## 2012-09-15 ENCOUNTER — Other Ambulatory Visit: Payer: Self-pay | Admitting: Family Medicine

## 2012-09-25 ENCOUNTER — Other Ambulatory Visit: Payer: Self-pay | Admitting: *Deleted

## 2012-09-25 ENCOUNTER — Other Ambulatory Visit: Payer: Self-pay | Admitting: Family Medicine

## 2012-09-25 MED ORDER — HYDROCODONE-ACETAMINOPHEN 10-325 MG PO TABS: 1.0000 | ORAL_TABLET | Freq: Four times a day (QID) | ORAL | Status: DC | PRN

## 2012-09-25 NOTE — Telephone Encounter (Signed)
plz phone in. Has been consistently requesting early.

## 2012-09-25 NOTE — Telephone Encounter (Signed)
Ok to refill 

## 2012-09-26 NOTE — Telephone Encounter (Signed)
Rx called in as directed.   

## 2012-10-21 ENCOUNTER — Other Ambulatory Visit: Payer: Self-pay | Admitting: *Deleted

## 2012-10-21 NOTE — Telephone Encounter (Signed)
Last filled 10/01/12 

## 2012-10-21 NOTE — Telephone Encounter (Signed)
Too early.  Last filled 10/01/2012.

## 2012-11-01 ENCOUNTER — Telehealth: Payer: Self-pay | Admitting: Family Medicine

## 2012-11-01 NOTE — Telephone Encounter (Signed)
hydrocodon acetanino 10/325mg  qty 70  She is out of meds feet are hurting

## 2012-11-02 MED ORDER — HYDROCODONE-ACETAMINOPHEN 10-325 MG PO TABS: 1.0000 | ORAL_TABLET | Freq: Four times a day (QID) | ORAL | Status: DC | PRN

## 2012-11-02 NOTE — Telephone Encounter (Signed)
plz phone in. 

## 2012-11-03 NOTE — Telephone Encounter (Signed)
Rx called in as directed.   

## 2012-11-13 DIAGNOSIS — R918 Other nonspecific abnormal finding of lung field: Secondary | ICD-10-CM

## 2012-11-13 HISTORY — DX: Other nonspecific abnormal finding of lung field: R91.8

## 2012-11-17 ENCOUNTER — Ambulatory Visit (INDEPENDENT_AMBULATORY_CARE_PROVIDER_SITE_OTHER): Payer: Medicare Other | Admitting: Family Medicine

## 2012-11-17 ENCOUNTER — Encounter: Payer: Self-pay | Admitting: Family Medicine

## 2012-11-17 ENCOUNTER — Telehealth: Payer: Self-pay

## 2012-11-17 VITALS — BP 130/64 | HR 74 | Temp 97.6°F | Wt 122.0 lb

## 2012-11-17 DIAGNOSIS — R413 Other amnesia: Secondary | ICD-10-CM

## 2012-11-17 DIAGNOSIS — F17200 Nicotine dependence, unspecified, uncomplicated: Secondary | ICD-10-CM

## 2012-11-17 DIAGNOSIS — R300 Dysuria: Secondary | ICD-10-CM

## 2012-11-17 DIAGNOSIS — Z0000 Encounter for general adult medical examination without abnormal findings: Secondary | ICD-10-CM

## 2012-11-17 DIAGNOSIS — I10 Essential (primary) hypertension: Secondary | ICD-10-CM

## 2012-11-17 DIAGNOSIS — J449 Chronic obstructive pulmonary disease, unspecified: Secondary | ICD-10-CM

## 2012-11-17 DIAGNOSIS — C3490 Malignant neoplasm of unspecified part of unspecified bronchus or lung: Secondary | ICD-10-CM

## 2012-11-17 DIAGNOSIS — E1149 Type 2 diabetes mellitus with other diabetic neurological complication: Secondary | ICD-10-CM

## 2012-11-17 DIAGNOSIS — Z720 Tobacco use: Secondary | ICD-10-CM

## 2012-11-17 DIAGNOSIS — C649 Malignant neoplasm of unspecified kidney, except renal pelvis: Secondary | ICD-10-CM

## 2012-11-17 DIAGNOSIS — R918 Other nonspecific abnormal finding of lung field: Secondary | ICD-10-CM

## 2012-11-17 DIAGNOSIS — N390 Urinary tract infection, site not specified: Secondary | ICD-10-CM

## 2012-11-17 DIAGNOSIS — C641 Malignant neoplasm of right kidney, except renal pelvis: Secondary | ICD-10-CM

## 2012-11-17 DIAGNOSIS — E785 Hyperlipidemia, unspecified: Secondary | ICD-10-CM

## 2012-11-17 LAB — POCT URINALYSIS DIPSTICK
Bilirubin, UA: NEGATIVE
Glucose, UA: NEGATIVE

## 2012-11-17 LAB — BASIC METABOLIC PANEL
Calcium: 9.2 mg/dL (ref 8.4–10.5)
GFR: 70.54 mL/min (ref >60.00)
Glucose, Bld: 112 mg/dL — ABNORMAL HIGH (ref 70–99)
Potassium: 4.3 mEq/L (ref 3.5–5.1)
Sodium: 134 mEq/L — ABNORMAL LOW (ref 135–145)

## 2012-11-17 LAB — HEMOGLOBIN A1C: Hgb A1c MFr Bld: 6.4 % (ref 4.6–6.5)

## 2012-11-17 MED ORDER — CIPROFLOXACIN HCL 250 MG PO TABS: 250.0000 mg | ORAL_TABLET | Freq: Two times a day (BID) | ORAL | Status: AC

## 2012-11-17 NOTE — Assessment & Plan Note (Signed)
Recheck A1c today.  Good control in past. Lab Results  Component Value Date   HGBA1C 6.7* 06/17/2012  pt not happy with podiatrist in [place], declines to return.

## 2012-11-17 NOTE — Assessment & Plan Note (Signed)
No recent scan.  Overdue for f/u of known pulmonary nodules - ordered today.

## 2012-11-17 NOTE — Patient Instructions (Addendum)
Good to see you today, call us with questions. Memory test was overall ok today - I'd recommend doing mind puzzles like sudoku, and continue reading. Urine checked today - we will call you with results. Pass by Marion's office to schedule CT of lungs to follow nodules. Work on cutting back on smoking!

## 2012-11-17 NOTE — Assessment & Plan Note (Signed)
Continue to encourage cessation. precontemplative. 

## 2012-11-17 NOTE — Assessment & Plan Note (Signed)
Story, UA and micro suspicious for infection. Will treat with 5 d course cipro 250mg  bid, as well as I've sent off urine culture.

## 2012-11-17 NOTE — Assessment & Plan Note (Signed)
Followed by uro (Dr. Vernie Ammons).

## 2012-11-17 NOTE — Progress Notes (Signed)
  Subjective:    Patient ID: Marissa Ho, female    DOB: [birth year], 79 y.o.   MRN: [id number]  HPI CC: geriatric assessment  Seen here 03/18/2012 for annual medicare wellness visit. Endorsed some trouble with memory at that time, so I asked her to return today for formal MMSE and geriatric assessment.   Recently saw Dr. Vernie Ammons to follow h/o kidney cancer. H/o lung cancer s/p right lobectomy 2006.  Last CT scan was 08/2010.  Due for repeat.  Has not followed with Dr. Edwyna Shell since around 2011.  Plan was regular lung imaging. H/o COPD, continues to smoke.  1 ppd.  Aware of risks.  Precontemplative.  Wants to come off topamax - states using for foot pain.  1d h/o dysuria - associated with urgency.  Denies fevers/chills, frequency, hematuria, abd pain, back pain, nausea/vomiting.  Requests UA for eval.  DM - due for recheck. Compliant with metformin. Lab Results  Component Value Date   HGBA1C 6.7* 06/17/2012     Geriatric Assessment:  Activities of Daily Living:     Bathing- independent    Dressing- independent    Eating- independent    Toileting-independent     Transferring-independent    Continence-independent Overall Assessment: independent  Instrumental Activities of Daily Living:     Transportation- partially dependent - son drives her when long distances    Meal/Food Preparation- independent    Shopping Errands- independent    Housekeeping/Chores- independent    Money Management/Finances- dependent on daughter    Medication Management- independent    Ability to Use Telephone- independent    Laundry- independent Overall Assessment: independent  Mental Status Exam: 26/30 (value/max value)   8th grade education level  Past Medical History  Diagnosis Date  . Arthritis     cervical, lumbar  . Depression   . Diabetes mellitus with neuropathy     seen neurologist in past  . GERD (gastroesophageal reflux disease)   . Seasonal allergies   . HTN (hypertension)   . HLD  (hyperlipidemia)   . Lung cancer 2006    lung adenocarcinoma s/p RULobectomy, CT scans Q6 mo  . Kidney carcinoma 2005    left kidney ?TCC s/p nephrectomy, sole right kidney remains  . DDD (degenerative disc disease), cervical 07/2011    NSG Dr. Lovell Sheehan, CT myelogram - nerve root cutoff C7-T1, L HNP C5/6, C6/7, C7/T1  . Hepatic steatosis   . CTS (carpal tunnel syndrome) 07/2011    On NCV/EMG- L CTS, with ulnar neuropathy  . COPD (chronic obstructive pulmonary disease) 09/26/2010    by CXR, normal PFTs per report 2007  . CAD (coronary artery disease), native coronary artery 2006    cath in [place], mod CAD, no focal obstruction, nl LV fxn  . Aortic valve stenosis, mild   . Neck pain     cervical spondylosis, cervicalgia, herniated disc and radiculopathy     Review of Systems Per HPI    Objective:   Physical Exam  Nursing note and vitals reviewed. Constitutional: She appears well-developed and well-nourished. No distress.  Abdominal: Soft. Bowel sounds are normal. She exhibits no distension and no mass. There is no hepatosplenomegaly. There is no tenderness. There is no rebound, no guarding and no CVA tenderness.  Psychiatric: She has a normal mood and affect. Her behavior is normal. Judgment and thought content normal.       Assessment & Plan:

## 2012-11-17 NOTE — Telephone Encounter (Signed)
Pt seen this morning by Dr Reece Agar and pt forgot to stop at Marion's office to schedule CT scan of lungs to f/u nodules. Wants scan in [place]. Pt request call back [phone].

## 2012-11-17 NOTE — Assessment & Plan Note (Signed)
CT scan with contrast ordered today. Check Cr today. Sole kidney.

## 2012-11-17 NOTE — Assessment & Plan Note (Signed)
Endorsed at last AMW.   MMSE overall stable compared to prior done 03/2011 Doubt current dementia issue but merits close monitoring. Overall independent in ADLs and IADLs.  Uses cane for assistance.

## 2012-11-18 ENCOUNTER — Encounter: Payer: Self-pay | Admitting: *Deleted

## 2012-11-21 ENCOUNTER — Ambulatory Visit (INDEPENDENT_AMBULATORY_CARE_PROVIDER_SITE_OTHER)
Admission: RE | Admit: 2012-11-21 | Discharge: 2012-11-21 | Disposition: A | Discharge status: Home / Self Care | Payer: Medicare Other | Source: Ambulatory Visit | Attending: Family Medicine | Admitting: Family Medicine

## 2012-11-21 DIAGNOSIS — Z720 Tobacco use: Secondary | ICD-10-CM

## 2012-11-21 DIAGNOSIS — J449 Chronic obstructive pulmonary disease, unspecified: Secondary | ICD-10-CM

## 2012-11-21 DIAGNOSIS — J4489 Other specified chronic obstructive pulmonary disease: Secondary | ICD-10-CM

## 2012-11-21 DIAGNOSIS — F17200 Nicotine dependence, unspecified, uncomplicated: Secondary | ICD-10-CM

## 2012-11-21 DIAGNOSIS — R918 Other nonspecific abnormal finding of lung field: Secondary | ICD-10-CM

## 2012-11-21 DIAGNOSIS — C3490 Malignant neoplasm of unspecified part of unspecified bronchus or lung: Secondary | ICD-10-CM

## 2012-11-21 MED ORDER — IOHEXOL 300 MG/ML  SOLN
80.0000 mL | Freq: Once | INTRAMUSCULAR | Status: AC | PRN
  Administered 2012-11-21: 80 mL via INTRAVENOUS

## 2012-11-23 ENCOUNTER — Encounter: Payer: Self-pay | Admitting: Family Medicine

## 2012-11-26 ENCOUNTER — Other Ambulatory Visit: Payer: Self-pay | Admitting: Family Medicine

## 2012-12-17 ENCOUNTER — Other Ambulatory Visit: Payer: Self-pay | Admitting: *Deleted

## 2012-12-17 MED ORDER — HYDROCODONE-ACETAMINOPHEN 10-325 MG PO TABS: 1.0000 | ORAL_TABLET | Freq: Four times a day (QID) | ORAL | Status: DC | PRN

## 2012-12-17 NOTE — Telephone Encounter (Signed)
Ok to refill 

## 2012-12-17 NOTE — Telephone Encounter (Signed)
plz phone in. 

## 2012-12-18 NOTE — Telephone Encounter (Signed)
Rx called in as prescribed 

## 2012-12-25 ENCOUNTER — Other Ambulatory Visit: Payer: Self-pay | Admitting: Family Medicine

## 2013-01-12 ENCOUNTER — Telehealth: Payer: Self-pay

## 2013-01-12 MED ORDER — BACLOFEN 10 MG PO TABS: 10.0000 mg | ORAL_TABLET | Freq: Three times a day (TID) | ORAL | Status: DC | PRN

## 2013-01-12 NOTE — Telephone Encounter (Signed)
plz ensure she continues to take her topamax and gabapentin. May try baclofen muscle relaxant - sent to pharmacy.  Caution it can make her sleepy.

## 2013-01-12 NOTE — Telephone Encounter (Signed)
Pt has pain in neck (pain level 9) and does not want to have surgery on neck problem (disc out); pt said Dr Sharen Hones is aware of neck problem(wants to wait for Dr Reece Agar to see phone message) and request muscle relaxant or other pain med stronger than Hydrocodone for pain. [facility].Please advise.

## 2013-01-13 NOTE — Telephone Encounter (Signed)
Message left advising patient. Advised to call with any questions.

## 2013-01-19 ENCOUNTER — Telehealth: Payer: Self-pay

## 2013-01-19 NOTE — Telephone Encounter (Signed)
pts daughter, Hayden Rasmussen left v/m; took pt to ED last night ? Reaction to muscle relaxant; pt was admitted to [facility] Rm 434. [name] wanted Dr Reece Agar to know. [name] did not know a phone # to leave. Left v/m at [phone] for [name] to cb for more info.

## 2013-01-21 NOTE — Telephone Encounter (Signed)
Message left for patient to return my call. Unable to reach daughter at number in system (incorrect). Will try again later.

## 2013-01-21 NOTE — Telephone Encounter (Signed)
Can we call for an update to see how she's doing?

## 2013-01-22 NOTE — Telephone Encounter (Signed)
Spoke with patient and she would like to speak with Dr. Henriette Combs he was seeing patients and would try to give her a call after he finishes for the day. She verbalized understanding.

## 2013-01-22 NOTE — Telephone Encounter (Signed)
Attempted to call patient. Female answered at number in system and gave me another to try to reach her next door. No answer, no machine. Will try again later.

## 2013-01-22 NOTE — Telephone Encounter (Signed)
Spoke with patient - she had reaction to baclofen, not topamax. Updated chart. Advised to bring me daughter's FMLA forms. Can we get records from [facility]. Using nicotine patch - very proud of this - no cigarette in 5 days.

## 2013-01-22 NOTE — Telephone Encounter (Signed)
Pt said was discharged from [facility] on 01/21/13 due to reaction to Topiramate, pt could not walk or talk; pt cannot stay by self so pt is going for physical therapy in [place] and will be staying with her son, Marissa Ho. Pt said her daughter, Marissa Ho needs FMLA filled out to go to [place] to visit pt and to pick up pt when pt is ready to return home. Marissa Ho will be bringing paperwork for Dr G to fill out. Pt is leaving 01/23/13 in AM going to [place].  Pt said she feels weak, has not been sleeping well and has lost weight. Pt not sure how much she weighs now. Pt has not smoked in 5 days; pt wearing patch. Pt wants to talk with Dr Reece Agar if he can call pt today at [phone] or after today call Marissa Ho pts son at [phone].

## 2013-01-26 ENCOUNTER — Other Ambulatory Visit: Payer: Self-pay | Admitting: Family Medicine

## 2013-01-26 NOTE — Telephone Encounter (Signed)
FMLA form in your IN box for completion.

## 2013-01-26 NOTE — Telephone Encounter (Signed)
OK to refill? [place], [place] [phone]

## 2013-01-26 NOTE — Telephone Encounter (Signed)
Tyson Alias Rio's daughter, dropped off [employer] paperwork for Dr. Reece Agar to complete for [name]. If there are any questions regarding the paperwork please call [name] at [phone]. After the form is complete it can be faxed to [employer] (HR at [employer]) at  fax: [phone]. If [name] needs to complete anything on the first page of paperwork, she can come by and complete before being faxed.  Thanks, Revonda Standard

## 2013-01-27 DIAGNOSIS — Z0279 Encounter for issue of other medical certificate: Secondary | ICD-10-CM

## 2013-01-27 MED ORDER — HYDROCODONE-ACETAMINOPHEN 10-325 MG PO TABS: 1.0000 | ORAL_TABLET | Freq: Four times a day (QID) | ORAL | Status: AC | PRN

## 2013-01-27 NOTE — Telephone Encounter (Signed)
Ok to phone in.

## 2013-01-27 NOTE — Telephone Encounter (Signed)
Rx called in as directed.   

## 2013-01-28 ENCOUNTER — Telehealth: Payer: Self-pay | Admitting: Family Medicine

## 2013-01-28 NOTE — Telephone Encounter (Signed)
plz notify patient- preliminary blood work shows he has a very low blood count - very anemic. This could explain his weakness he's had worsening over the last several months and could be coming from his prostate cancer. Is he having dizziness, chest pain, shortness of breath? Has he noticed any bleeding in stool or elsewhere? 

## 2013-01-28 NOTE — Telephone Encounter (Signed)
Below message placed on wrong patient.

## 2013-01-28 NOTE — Telephone Encounter (Signed)
Please clarify. This is a female patient.

## 2013-01-28 NOTE — Telephone Encounter (Signed)
Records requested

## 2013-01-29 NOTE — Telephone Encounter (Signed)
Received, reviewed records from [name].  Hospitalization 7/7 to 7/9 for reaction to baclofen. Filled out FMLA and placed in Marissa Ho's box. [name] needs to complete first page.

## 2013-01-30 NOTE — Telephone Encounter (Signed)
Message left notifying daughter. Forms placed up front for pick up.

## 2013-05-20 ENCOUNTER — Encounter: Payer: MEDICARE | Admitting: Family Medicine

## 2013-10-24 ENCOUNTER — Telehealth: Payer: Self-pay | Admitting: Family Medicine

## 2013-10-24 NOTE — Telephone Encounter (Signed)
Plz call to f/u with patient - she is due for f/u lung CT for pulmonary nodules.  If still living out of state offer to send latest lung CT to current PCP.

## 2013-10-28 NOTE — Telephone Encounter (Signed)
Message left for patient's son to return my call and advise. Daughter's # out of service.

## 2013-11-02 ENCOUNTER — Encounter: Payer: Self-pay | Admitting: *Deleted

## 2013-11-02 NOTE — Telephone Encounter (Signed)
Letter mailed trying to reach patient's children and have them contact me with info.

## 2013-12-28 ENCOUNTER — Other Ambulatory Visit: Payer: Self-pay | Admitting: Family Medicine

## 2014-01-05 ENCOUNTER — Telehealth: Payer: Self-pay | Admitting: *Deleted

## 2014-01-05 NOTE — Telephone Encounter (Signed)
Spoke with patient who advised her new doctor in [place] was Dr. Zenia Resides. Faxed copy of lung CT to him for review. [phone] (p) and [phone] (f). She said she broke her pelvis and is probably just going to end up staying in [place].

## 2018-05-19 DIAGNOSIS — I350 Nonrheumatic aortic (valve) stenosis: Secondary | ICD-10-CM

## 2018-05-19 DIAGNOSIS — I509 Heart failure, unspecified: Secondary | ICD-10-CM

## 2018-05-19 DIAGNOSIS — I361 Nonrheumatic tricuspid (valve) insufficiency: Secondary | ICD-10-CM | POA: Diagnosis not present

## 2018-05-19 DIAGNOSIS — I340 Nonrheumatic mitral (valve) insufficiency: Secondary | ICD-10-CM | POA: Diagnosis not present

## 2018-05-19 DIAGNOSIS — J9611 Chronic respiratory failure with hypoxia: Secondary | ICD-10-CM

## 2018-05-19 DIAGNOSIS — E7800 Pure hypercholesterolemia, unspecified: Secondary | ICD-10-CM

## 2018-05-19 DIAGNOSIS — Z720 Tobacco use: Secondary | ICD-10-CM

## 2018-05-19 DIAGNOSIS — J449 Chronic obstructive pulmonary disease, unspecified: Secondary | ICD-10-CM

## 2018-05-19 DIAGNOSIS — J9601 Acute respiratory failure with hypoxia: Secondary | ICD-10-CM

## 2018-05-19 DIAGNOSIS — E1149 Type 2 diabetes mellitus with other diabetic neurological complication: Secondary | ICD-10-CM

## 2018-05-19 DIAGNOSIS — I352 Nonrheumatic aortic (valve) stenosis with insufficiency: Secondary | ICD-10-CM

## 2018-05-19 DIAGNOSIS — I10 Essential (primary) hypertension: Secondary | ICD-10-CM

## 2018-05-20 DIAGNOSIS — J9611 Chronic respiratory failure with hypoxia: Secondary | ICD-10-CM | POA: Diagnosis not present

## 2018-05-20 DIAGNOSIS — J9601 Acute respiratory failure with hypoxia: Secondary | ICD-10-CM | POA: Diagnosis not present

## 2018-05-20 DIAGNOSIS — J449 Chronic obstructive pulmonary disease, unspecified: Secondary | ICD-10-CM | POA: Diagnosis not present

## 2018-05-20 DIAGNOSIS — I509 Heart failure, unspecified: Secondary | ICD-10-CM | POA: Diagnosis not present

## 2018-05-21 DIAGNOSIS — I509 Heart failure, unspecified: Secondary | ICD-10-CM | POA: Diagnosis not present

## 2018-05-21 DIAGNOSIS — J9611 Chronic respiratory failure with hypoxia: Secondary | ICD-10-CM | POA: Diagnosis not present

## 2018-05-21 DIAGNOSIS — J449 Chronic obstructive pulmonary disease, unspecified: Secondary | ICD-10-CM | POA: Diagnosis not present

## 2018-05-21 DIAGNOSIS — J9601 Acute respiratory failure with hypoxia: Secondary | ICD-10-CM | POA: Diagnosis not present

## 2018-07-18 DIAGNOSIS — J449 Chronic obstructive pulmonary disease, unspecified: Secondary | ICD-10-CM

## 2018-07-18 DIAGNOSIS — E7800 Pure hypercholesterolemia, unspecified: Secondary | ICD-10-CM

## 2018-07-18 DIAGNOSIS — K219 Gastro-esophageal reflux disease without esophagitis: Secondary | ICD-10-CM

## 2018-07-18 DIAGNOSIS — A084 Viral intestinal infection, unspecified: Secondary | ICD-10-CM

## 2018-07-18 DIAGNOSIS — I160 Hypertensive urgency: Secondary | ICD-10-CM

## 2018-07-18 DIAGNOSIS — R4182 Altered mental status, unspecified: Secondary | ICD-10-CM | POA: Diagnosis not present

## 2018-07-18 DIAGNOSIS — I10 Essential (primary) hypertension: Secondary | ICD-10-CM

## 2018-07-18 DIAGNOSIS — R197 Diarrhea, unspecified: Secondary | ICD-10-CM | POA: Diagnosis not present

## 2018-07-18 DIAGNOSIS — Z720 Tobacco use: Secondary | ICD-10-CM

## 2018-07-18 DIAGNOSIS — E1149 Type 2 diabetes mellitus with other diabetic neurological complication: Secondary | ICD-10-CM

## 2018-07-18 DIAGNOSIS — J9611 Chronic respiratory failure with hypoxia: Secondary | ICD-10-CM

## 2018-07-18 DIAGNOSIS — E876 Hypokalemia: Secondary | ICD-10-CM

## 2018-07-18 DIAGNOSIS — R531 Weakness: Secondary | ICD-10-CM | POA: Diagnosis not present

## 2019-11-14 DEATH — deceased
